# Patient Record
Sex: Female | Born: 1935 | Race: White | Hispanic: No | Marital: Married | Smoking: Former smoker
Health system: Southern US, Community
[De-identification: ages and names within clinical notes are randomized; demographics above are authoritative.]

## PROBLEM LIST (undated history)

## (undated) DIAGNOSIS — M810 Age-related osteoporosis without current pathological fracture: Secondary | ICD-10-CM

## (undated) DIAGNOSIS — H9190 Unspecified hearing loss, unspecified ear: Secondary | ICD-10-CM

## (undated) DIAGNOSIS — M549 Dorsalgia, unspecified: Secondary | ICD-10-CM

## (undated) DIAGNOSIS — G309 Alzheimer's disease, unspecified: Secondary | ICD-10-CM

## (undated) DIAGNOSIS — F329 Major depressive disorder, single episode, unspecified: Secondary | ICD-10-CM

## (undated) DIAGNOSIS — F0280 Dementia in other diseases classified elsewhere without behavioral disturbance: Secondary | ICD-10-CM

## (undated) DIAGNOSIS — R2681 Unsteadiness on feet: Secondary | ICD-10-CM

## (undated) DIAGNOSIS — F32A Depression, unspecified: Secondary | ICD-10-CM

## (undated) DIAGNOSIS — F0390 Unspecified dementia without behavioral disturbance: Secondary | ICD-10-CM

## (undated) DIAGNOSIS — F419 Anxiety disorder, unspecified: Secondary | ICD-10-CM

## (undated) DIAGNOSIS — I341 Nonrheumatic mitral (valve) prolapse: Secondary | ICD-10-CM

## (undated) DIAGNOSIS — I059 Rheumatic mitral valve disease, unspecified: Secondary | ICD-10-CM

## (undated) DIAGNOSIS — R32 Unspecified urinary incontinence: Secondary | ICD-10-CM

## (undated) DIAGNOSIS — G8929 Other chronic pain: Secondary | ICD-10-CM

## (undated) DIAGNOSIS — E0590 Thyrotoxicosis, unspecified without thyrotoxic crisis or storm: Secondary | ICD-10-CM

## (undated) DIAGNOSIS — E039 Hypothyroidism, unspecified: Secondary | ICD-10-CM

## (undated) HISTORY — PX: APPENDECTOMY: SHX54

## (undated) HISTORY — PX: OTHER SURGICAL HISTORY: SHX169

---

## 2008-10-17 ENCOUNTER — Inpatient Hospital Stay (HOSPITAL_COMMUNITY): Admission: EM | Admit: 2008-10-17 | Discharge: 2008-10-23 | Payer: Self-pay | Admitting: Emergency Medicine

## 2009-02-07 ENCOUNTER — Encounter: Payer: Self-pay | Admitting: Family Medicine

## 2009-06-24 ENCOUNTER — Emergency Department (HOSPITAL_COMMUNITY): Admission: EM | Admit: 2009-06-24 | Discharge: 2009-06-24 | Payer: Self-pay | Admitting: Emergency Medicine

## 2009-08-16 ENCOUNTER — Ambulatory Visit: Payer: Self-pay | Admitting: Diagnostic Radiology

## 2009-08-16 ENCOUNTER — Emergency Department (HOSPITAL_BASED_OUTPATIENT_CLINIC_OR_DEPARTMENT_OTHER): Admission: EM | Admit: 2009-08-16 | Discharge: 2009-08-16 | Payer: Self-pay | Admitting: Emergency Medicine

## 2010-06-07 LAB — GLUCOSE, CAPILLARY
Glucose-Capillary: 103 mg/dL — ABNORMAL HIGH (ref 70–99)
Glucose-Capillary: 105 mg/dL — ABNORMAL HIGH (ref 70–99)
Glucose-Capillary: 111 mg/dL — ABNORMAL HIGH (ref 70–99)
Glucose-Capillary: 113 mg/dL — ABNORMAL HIGH (ref 70–99)
Glucose-Capillary: 115 mg/dL — ABNORMAL HIGH (ref 70–99)
Glucose-Capillary: 123 mg/dL — ABNORMAL HIGH (ref 70–99)
Glucose-Capillary: 127 mg/dL — ABNORMAL HIGH (ref 70–99)
Glucose-Capillary: 129 mg/dL — ABNORMAL HIGH (ref 70–99)
Glucose-Capillary: 132 mg/dL — ABNORMAL HIGH (ref 70–99)
Glucose-Capillary: 142 mg/dL — ABNORMAL HIGH (ref 70–99)
Glucose-Capillary: 146 mg/dL — ABNORMAL HIGH (ref 70–99)
Glucose-Capillary: 152 mg/dL — ABNORMAL HIGH (ref 70–99)
Glucose-Capillary: 93 mg/dL (ref 70–99)
Glucose-Capillary: 95 mg/dL (ref 70–99)

## 2010-06-07 LAB — DIFFERENTIAL
Basophils Absolute: 0 10*3/uL (ref 0.0–0.1)
Basophils Relative: 1 % (ref 0–1)
Eosinophils Absolute: 0 10*3/uL (ref 0.0–0.7)
Eosinophils Relative: 0 % (ref 0–5)
Lymphocytes Relative: 8 % — ABNORMAL LOW (ref 12–46)
Lymphs Abs: 0.9 10*3/uL (ref 0.7–4.0)
Monocytes Absolute: 0.6 10*3/uL (ref 0.1–1.0)
Monocytes Relative: 5 % (ref 3–12)
Neutro Abs: 8.9 10*3/uL — ABNORMAL HIGH (ref 1.7–7.7)
Neutrophils Relative %: 85 % — ABNORMAL HIGH (ref 43–77)

## 2010-06-07 LAB — BASIC METABOLIC PANEL
BUN: 13 mg/dL (ref 6–23)
BUN: 13 mg/dL (ref 6–23)
BUN: 17 mg/dL (ref 6–23)
BUN: 8 mg/dL (ref 6–23)
BUN: 9 mg/dL (ref 6–23)
CO2: 26 mEq/L (ref 19–32)
CO2: 26 mEq/L (ref 19–32)
CO2: 27 mEq/L (ref 19–32)
Calcium: 8.1 mg/dL — ABNORMAL LOW (ref 8.4–10.5)
Calcium: 8.4 mg/dL (ref 8.4–10.5)
Calcium: 9.7 mg/dL (ref 8.4–10.5)
Chloride: 106 mEq/L (ref 96–112)
Creatinine, Ser: 0.46 mg/dL (ref 0.4–1.2)
Creatinine, Ser: 0.54 mg/dL (ref 0.4–1.2)
Creatinine, Ser: 0.55 mg/dL (ref 0.4–1.2)
GFR calc Af Amer: >60 mL/min (ref >60)
GFR calc Af Amer: >60 mL/min (ref >60)
GFR calc Af Amer: >60 mL/min (ref >60)
GFR calc Af Amer: >60 mL/min (ref >60)
GFR calc non Af Amer: >60 mL/min (ref >60)
GFR calc non Af Amer: >60 mL/min (ref >60)
GFR calc non Af Amer: >60 mL/min (ref >60)
GFR calc non Af Amer: >60 mL/min (ref >60)
GFR calc non Af Amer: >60 mL/min (ref >60)
Glucose, Bld: 100 mg/dL — ABNORMAL HIGH (ref 70–99)
Glucose, Bld: 100 mg/dL — ABNORMAL HIGH (ref 70–99)
Glucose, Bld: 129 mg/dL — ABNORMAL HIGH (ref 70–99)
Glucose, Bld: 89 mg/dL (ref 70–99)
Glucose, Bld: 93 mg/dL (ref 70–99)
Potassium: 3.4 mEq/L — ABNORMAL LOW (ref 3.5–5.1)
Potassium: 3.5 mEq/L (ref 3.5–5.1)
Potassium: 3.6 mEq/L (ref 3.5–5.1)
Potassium: 3.7 mEq/L (ref 3.5–5.1)
Potassium: 3.8 mEq/L (ref 3.5–5.1)
Sodium: 138 mEq/L (ref 135–145)
Sodium: 138 mEq/L (ref 135–145)
Sodium: 139 mEq/L (ref 135–145)
Sodium: 140 mEq/L (ref 135–145)

## 2010-06-07 LAB — POCT I-STAT EG7
Bicarbonate: 24.6 mEq/L — ABNORMAL HIGH (ref 20.0–24.0)
Calcium, Ion: 1.16 mmol/L (ref 1.12–1.32)
Sodium: 139 mEq/L (ref 135–145)
TCO2: 26 mmol/L (ref 0–100)
pCO2, Ven: 32.7 mmHg — ABNORMAL LOW (ref 45.0–50.0)
pO2, Ven: 51 mmHg — ABNORMAL HIGH (ref 30.0–45.0)

## 2010-06-07 LAB — CBC
HCT: 23.2 % — ABNORMAL LOW (ref 36.0–46.0)
HCT: 25.1 % — ABNORMAL LOW (ref 36.0–46.0)
HCT: 25.5 % — ABNORMAL LOW (ref 36.0–46.0)
HCT: 27.1 % — ABNORMAL LOW (ref 36.0–46.0)
HCT: 27.3 % — ABNORMAL LOW (ref 36.0–46.0)
Hemoglobin: 13.4 g/dL (ref 12.0–15.0)
Hemoglobin: 8.6 g/dL — ABNORMAL LOW (ref 12.0–15.0)
Hemoglobin: 9.3 g/dL — ABNORMAL LOW (ref 12.0–15.0)
MCHC: 34.4 g/dL (ref 30.0–36.0)
MCHC: 34.7 g/dL (ref 30.0–36.0)
Platelets: 116 10*3/uL — ABNORMAL LOW (ref 150–400)
Platelets: 132 10*3/uL — ABNORMAL LOW (ref 150–400)
Platelets: 151 10*3/uL (ref 150–400)
RBC: 2.7 MIL/uL — ABNORMAL LOW (ref 3.87–5.11)
RBC: 2.97 MIL/uL — ABNORMAL LOW (ref 3.87–5.11)
RBC: 4.69 MIL/uL (ref 3.87–5.11)
RDW: 13.7 % (ref 11.5–15.5)
RDW: 14.4 % (ref 11.5–15.5)
RDW: 14.6 % (ref 11.5–15.5)
RDW: 14.7 % (ref 11.5–15.5)
RDW: 15 % (ref 11.5–15.5)
WBC: 10 10*3/uL (ref 4.0–10.5)

## 2010-06-07 LAB — TYPE AND SCREEN
ABO/RH(D): A NEG
Antibody Screen: NEGATIVE

## 2010-06-07 LAB — URINALYSIS, MICROSCOPIC ONLY
Ketones, ur: NEGATIVE mg/dL
Protein, ur: NEGATIVE mg/dL
Urobilinogen, UA: 1 mg/dL (ref 0.0–1.0)

## 2010-06-07 LAB — PROTIME-INR: INR: 1.1 (ref 0.00–1.49)

## 2010-06-07 LAB — HEMOGLOBIN A1C
Hgb A1c MFr Bld: 5.4 % (ref 4.6–6.1)
Mean Plasma Glucose: 108 mg/dL

## 2010-06-07 LAB — APTT: aPTT: 26 seconds (ref 24–37)

## 2010-06-07 LAB — POCT CARDIAC MARKERS: Troponin i, poc: <0.05 ng/mL (ref 0.00–0.09)

## 2010-06-07 LAB — ABO/RH: ABO/RH(D): A NEG

## 2010-07-15 NOTE — Op Note (Signed)
NAME:  Jillian Wilkerson, Jillian Wilkerson              ACCOUNT NO.:  [id number]   MEDICAL RECORD NO.:  [id number]          PATIENT TYPE:  INP   LOCATION:  3086                         FACILITY:  [facility]   PHYSICIAN:  Dyke Brackett, M.D.    DATE OF BIRTH:  Dec 15, 1924   DATE OF PROCEDURE:  DATE OF DISCHARGE:                               OPERATIVE REPORT   PREOPERATIVE DIAGNOSIS:  Severe periprosthetic right femur fracture.   POSTOPERATIVE DIAGNOSIS:  Severe periprosthetic right femur fracture.   OPERATION:  Open reduction, internal fixation right femur periprosthetic  femur fracture with Synthes 16 hole periprosthetic plate.   SURGEON:  Dr. Madelon Lips.   ASSISTANT:  Smith, PA.   BLOOD LOSS:  Approximately 500.   DESCRIPTION OF PROCEDURE:  Sterile prep and drape, supine positioning  with an incision laterally.  An extended lateral incision had been made  due the fact that the prosthesis fracture which appeared to start more  at the tip of the prosthesis actually extended very proximal, probably  to include the proximal femur.  An extended lateral approach was made  with splitting of the iliotibial band and vastus lateralis.  Identification of a minimally comminuted, but very long spiral fracture.  The main component was in the sagittal plane.  It was relatively  shortened.  Provisional reduction was carried out with traction and  clamps in the plate.  Initially, it was thought that the plate would not  have to extend as far proximally.  It was extended as far proximally as  it could be relative to the flare of the greater trochanter proximally  and distally near the distal end of the femur.  Provisional clamps were  placed followed by one or two screws were placed distally.  This was  judged to be an acceptable position.  Then with the clamps in places,  the four cables were attached and Synthes curved plate with islets  __________good stable fixation relative to the four cables was obtained.  Two lag  screws were placed through an oblique portion of the fracture  distal to the cement mantle, followed by placement of cortical and  locking screws at the distal end of the plate.  Not all the screw holes  were placed used with screws, but good stable fixation distally and  really all cables were placed.  The most proximal islet could not be  utilized due to the exposure, but I felt like given the situation that  we had obtained excellent fixation.  Some question of the stem  stability, but it was not grossly loose and we did examined her under  fluoroscopy after placement of all screws and the stem and shaft moved  as a unit.  The hip seemed to be stable.  The wound was irrigated.  The  vastus lateralis and iliotibial band were closed separately using  #1 Vicryl.  A Hemovac drain placed deep to the vastus lateralis.  The  subcutaneous tissue with 0 and 2-0 Vicryl and the skin with stapling  device.  Marcaine with epinephrine.  Lightly pressure sterile dressing  applied.  Taken to the recovery  room in stable condition.      Dyke Brackett, M.D.  Electronically Signed     WDC/MEDQ  D:  10/17/2008  T:  10/18/2008  Job:  [id number]

## 2010-07-15 NOTE — Discharge Summary (Signed)
Jillian Wilkerson, Jillian Wilkerson              ACCOUNT NO.:  [id number]   MEDICAL RECORD NO.:  [id number]          PATIENT TYPE:  INP   LOCATION:  1537                         FACILITY:  [facility]   PHYSICIAN:  Dyke Brackett, M.D.    DATE OF BIRTH:  [birth year]   DATE OF ADMISSION:  10/17/2008  DATE OF DISCHARGE:  10/22/2008                               DISCHARGE SUMMARY   ADMISSION DIAGNOSIS:  Right humeral shaft fracture.   FINAL DIAGNOSIS:  Status post right femur open reduction and internal  fixation for right femur shaft fracture.   HISTORY:  The patient presented to emergency department following a  fall.  Right femur fracture below her hip prosthesis.  She was taken up  to the OR suite and ORIF was performed.  The patient was then taken to  the PACU in stable condition.  Blood loss about 500.   HOSPITAL COURSE:  Jillian Wilkerson is an 77 year old female who came from  the OR up to 1500 here at [facility] in stable condition after  being in the PACU.  Postoperative day #1 was on October 18, 2008.  The  patient was doing well, but her hemoglobin was low at 8 so we transfused  two units of packed red blood cells, was instructed to be 25%  weightbearing.  Consideration for skilled nursing facility.  On August  20,  postop day #2.  Had not  been up with physical therapy due to them  not coming to see her and get her up with her getting a blood  transfusion.  Otherwise the patient was doing well.  Pain well-  controlled with oral pain medicine, hemoglobin of 9.5, potassium at 3.4.  Gave her 20 mEq of potassium.  Get started with PT is highly recommended  at this point. Strong consideration for skilled nursing facility.  Postoperative day #3 would then October 20, 2008.  The patient  doing  well, vitals stable, hemoglobin 9.3 and exam is benign.  Postoperative  day #4 was a October 22, 2008.  The patient has been up with therapy,  doing well but only transfers and very slow with therapy.   Anticipates  skilled nursing facility.  Vitals stable, afebrile.  Hemoglobin 8.7.  Postoperative day #5, October 22, 2008.  The patient doing well.  Plan  for skilled nursing facility if bed available.  Not having any problems  with chest pain, abdominal pain, shortness of breath, vital signs  stable, afebrile.  Hemoglobin stable at 8.6.  Right lower extremity  neurovascularly intact, pulse 1+ and anticipation of skilled nursing  facility today if bed available.   ASSESSMENT/PLAN:  This 77 year old female, status post right femoral  shaft fracture open reduction and internal fixation.   DIET:  Regular.   ACTIVITY:  Would recommend 25% weightbearing with PT/OT on that right  lower extremity with rolling walker.   FOLLOW UP:  Plan to get her staples removed 14 days postoperatively.  She will need to make an appointment with Dr. Madelon Lips.  Call [phone]  for appointment approximately 8-9 days from discharge date from the  hospital.  WOUND CARE:  Recommended daily dressing changes.  Keep her wound covered  until we see her in the office for staple removal.   Call office for any temperatures over 101 degrees Fahrenheit.  The  patient is discharged in stable condition on the following medications.   DISCHARGE MEDICATIONS:  1. Darvocet N 100 1 tablet p.o. q.4 h p.r.n. pain.  2. Aricept 10 mg p.o. at bedtime.  3. Levothyroxine 75 mcg q.a.m.  4. Lexapro 10 mg q.a.m.  5. Simvastatin 20 mg at bedtime.  6. Calcium and vitamin D three times a day.  7. Boniva apparently only on Saturdays.  8. Robaxin 500 mg 1 tablet p.o. q.6-8h. p.r.n. pain.  9. Lovenox 40 mg subcu at 11 a.m. each morning.  Plan for a total of      15 days postoperatively which would be approximately September 1.      Sharol Given, [place]      Dyke Brackett, M.D.  Electronically Signed    JBS/MEDQ  D:  10/22/2008  T:  10/22/2008  Job:  [id number]

## 2011-03-05 ENCOUNTER — Inpatient Hospital Stay (HOSPITAL_COMMUNITY)
Admission: EM | Admit: 2011-03-05 | Discharge: 2011-03-09 | DRG: 193 | Payer: Medicare Other | Source: Ambulatory Visit | Attending: Internal Medicine | Admitting: Internal Medicine

## 2011-03-05 ENCOUNTER — Encounter: Payer: Self-pay | Admitting: Emergency Medicine

## 2011-03-05 ENCOUNTER — Emergency Department (HOSPITAL_COMMUNITY): Payer: Medicare Other

## 2011-03-05 ENCOUNTER — Other Ambulatory Visit: Payer: Self-pay

## 2011-03-05 DIAGNOSIS — J189 Pneumonia, unspecified organism: Secondary | ICD-10-CM

## 2011-03-05 DIAGNOSIS — G309 Alzheimer's disease, unspecified: Secondary | ICD-10-CM | POA: Diagnosis present

## 2011-03-05 DIAGNOSIS — T502X5A Adverse effect of carbonic-anhydrase inhibitors, benzothiadiazides and other diuretics, initial encounter: Secondary | ICD-10-CM | POA: Diagnosis present

## 2011-03-05 DIAGNOSIS — R32 Unspecified urinary incontinence: Secondary | ICD-10-CM | POA: Diagnosis present

## 2011-03-05 DIAGNOSIS — F3289 Other specified depressive episodes: Secondary | ICD-10-CM | POA: Diagnosis present

## 2011-03-05 DIAGNOSIS — F0390 Unspecified dementia without behavioral disturbance: Secondary | ICD-10-CM

## 2011-03-05 DIAGNOSIS — I5031 Acute diastolic (congestive) heart failure: Secondary | ICD-10-CM | POA: Diagnosis present

## 2011-03-05 DIAGNOSIS — I517 Cardiomegaly: Secondary | ICD-10-CM

## 2011-03-05 DIAGNOSIS — F329 Major depressive disorder, single episode, unspecified: Secondary | ICD-10-CM

## 2011-03-05 DIAGNOSIS — E0590 Thyrotoxicosis, unspecified without thyrotoxic crisis or storm: Secondary | ICD-10-CM | POA: Diagnosis present

## 2011-03-05 DIAGNOSIS — E876 Hypokalemia: Secondary | ICD-10-CM | POA: Diagnosis present

## 2011-03-05 DIAGNOSIS — F0280 Dementia in other diseases classified elsewhere without behavioral disturbance: Secondary | ICD-10-CM | POA: Diagnosis present

## 2011-03-05 HISTORY — DX: Anxiety disorder, unspecified: F41.9

## 2011-03-05 HISTORY — DX: Alzheimer's disease, unspecified: G30.9

## 2011-03-05 HISTORY — DX: Unspecified dementia, unspecified severity, without behavioral disturbance, psychotic disturbance, mood disturbance, and anxiety: F03.90

## 2011-03-05 HISTORY — DX: Thyrotoxicosis, unspecified without thyrotoxic crisis or storm: E05.90

## 2011-03-05 HISTORY — DX: Dementia in other diseases classified elsewhere, unspecified severity, without behavioral disturbance, psychotic disturbance, mood disturbance, and anxiety: F02.80

## 2011-03-05 HISTORY — DX: Major depressive disorder, single episode, unspecified: F32.9

## 2011-03-05 HISTORY — DX: Depression, unspecified: F32.A

## 2011-03-05 LAB — BASIC METABOLIC PANEL
BUN: 15 mg/dL (ref 6–23)
Chloride: 99 mEq/L (ref 96–112)
GFR calc Af Amer: >90 mL/min (ref >90)
GFR calc non Af Amer: 82 mL/min — ABNORMAL LOW (ref >90)
Potassium: 3.8 mEq/L (ref 3.5–5.1)
Sodium: 133 mEq/L — ABNORMAL LOW (ref 135–145)

## 2011-03-05 LAB — DIFFERENTIAL
Basophils Relative: 0 % (ref 0–1)
Eosinophils Absolute: 0 10*3/uL (ref 0.0–0.7)
Monocytes Relative: 5 % (ref 3–12)
Neutro Abs: 7.4 10*3/uL (ref 1.7–7.7)
Neutrophils Relative %: 86 % — ABNORMAL HIGH (ref 43–77)

## 2011-03-05 LAB — URINALYSIS, ROUTINE W REFLEX MICROSCOPIC
Bilirubin Urine: NEGATIVE
Nitrite: POSITIVE — AB
Specific Gravity, Urine: 1.023 (ref 1.005–1.030)
Urobilinogen, UA: 1 mg/dL (ref 0.0–1.0)
pH: 5.5 (ref 5.0–8.0)

## 2011-03-05 LAB — CBC
HCT: 39 % (ref 36.0–46.0)
Hemoglobin: 13.9 g/dL (ref 12.0–15.0)
Hemoglobin: 13 g/dL (ref 12.0–15.0)
MCHC: 33.3 g/dL (ref 30.0–36.0)
MCV: 90.1 fL (ref 78.0–100.0)
Platelets: 156 10*3/uL (ref 150–400)
RBC: 4.6 MIL/uL (ref 3.87–5.11)
RBC: 4.33 MIL/uL (ref 3.87–5.11)
RDW: 14.4 % (ref 11.5–15.5)
WBC: 5.9 10*3/uL (ref 4.0–10.5)

## 2011-03-05 LAB — GLUCOSE, CAPILLARY: Glucose-Capillary: 130 mg/dL — ABNORMAL HIGH (ref 70–99)

## 2011-03-05 LAB — CREATININE, SERUM
GFR calc Af Amer: >90 mL/min (ref >90)
GFR calc non Af Amer: 85 mL/min — ABNORMAL LOW (ref >90)

## 2011-03-05 LAB — URINE MICROSCOPIC-ADD ON

## 2011-03-05 MED ORDER — LEVOFLOXACIN IN D5W 750 MG/150ML IV SOLN: 750.0000 mg | INTRAVENOUS | Status: DC

## 2011-03-05 MED ORDER — ONDANSETRON HCL 4 MG PO TABS: 4.0000 mg | ORAL_TABLET | Freq: Four times a day (QID) | ORAL | Status: DC | PRN

## 2011-03-05 MED ORDER — ACETAMINOPHEN 325 MG PO TABS: 650.0000 mg | ORAL_TABLET | Freq: Four times a day (QID) | ORAL | Status: DC | PRN

## 2011-03-05 MED ORDER — LEVOFLOXACIN IN D5W 500 MG/100ML IV SOLN
500.0000 mg | INTRAVENOUS | Status: DC
  Administered 2011-03-06 – 2011-03-08 (×3): 500 mg via INTRAVENOUS
  Filled 2011-03-05 (×4): qty 100

## 2011-03-05 MED ORDER — SODIUM CHLORIDE 0.9 % IV SOLN: 250.0000 mL | INTRAVENOUS | Status: DC | PRN

## 2011-03-05 MED ORDER — VANCOMYCIN HCL 1000 MG IV SOLR: 1500.0000 mg | Freq: Once | INTRAVENOUS | Status: DC

## 2011-03-05 MED ORDER — SODIUM CHLORIDE 0.9 % IV BOLUS (SEPSIS)
1000.0000 mL | Freq: Once | INTRAVENOUS | Status: AC
  Administered 2011-03-05: 1000 mL via INTRAVENOUS

## 2011-03-05 MED ORDER — TRAZODONE 25 MG HALF TABLET
25.0000 mg | ORAL_TABLET | Freq: Every evening | ORAL | Status: DC | PRN
  Administered 2011-03-06: 25 mg via ORAL
  Filled 2011-03-05: qty 1

## 2011-03-05 MED ORDER — ENOXAPARIN SODIUM 30 MG/0.3ML ~~LOC~~ SOLN
30.0000 mg | Freq: Every day | SUBCUTANEOUS | Status: DC
  Administered 2011-03-05: 30 mg via SUBCUTANEOUS
  Filled 2011-03-05 (×2): qty 0.3

## 2011-03-05 MED ORDER — ACETAMINOPHEN 650 MG RE SUPP: 650.0000 mg | Freq: Four times a day (QID) | RECTAL | Status: DC | PRN

## 2011-03-05 MED ORDER — SODIUM CHLORIDE 0.9 % IJ SOLN: 3.0000 mL | INTRAMUSCULAR | Status: DC | PRN

## 2011-03-05 MED ORDER — DOCUSATE SODIUM 100 MG PO CAPS
100.0000 mg | ORAL_CAPSULE | Freq: Two times a day (BID) | ORAL | Status: DC
  Administered 2011-03-05 – 2011-03-09 (×8): 100 mg via ORAL
  Filled 2011-03-05 (×8): qty 1

## 2011-03-05 MED ORDER — NYSTATIN 100000 UNIT/GM EX CREA
TOPICAL_CREAM | Freq: Two times a day (BID) | CUTANEOUS | Status: DC
  Administered 2011-03-05 – 2011-03-09 (×8): via TOPICAL
  Filled 2011-03-05: qty 15

## 2011-03-05 MED ORDER — DEXTROSE 5 % IV SOLN: 2.0000 g | Freq: Three times a day (TID) | INTRAVENOUS | Status: DC

## 2011-03-05 MED ORDER — ONDANSETRON HCL 4 MG/2ML IJ SOLN: 4.0000 mg | Freq: Four times a day (QID) | INTRAMUSCULAR | Status: DC | PRN

## 2011-03-05 MED ORDER — VANCOMYCIN HCL 1000 MG IV SOLR
750.0000 mg | Freq: Two times a day (BID) | INTRAVENOUS | Status: DC
  Administered 2011-03-05 – 2011-03-08 (×6): 750 mg via INTRAVENOUS
  Filled 2011-03-05 (×7): qty 750

## 2011-03-05 MED ORDER — ALUM & MAG HYDROXIDE-SIMETH 200-200-20 MG/5ML PO SUSP: 30.0000 mL | Freq: Four times a day (QID) | ORAL | Status: DC | PRN

## 2011-03-05 MED ORDER — SODIUM CHLORIDE 0.9 % IJ SOLN: 3.0000 mL | Freq: Two times a day (BID) | INTRAMUSCULAR | Status: DC

## 2011-03-05 MED ORDER — LEVALBUTEROL HCL 0.63 MG/3ML IN NEBU
0.6300 mg | INHALATION_SOLUTION | Freq: Four times a day (QID) | RESPIRATORY_TRACT | Status: DC | PRN
  Administered 2011-03-08 (×2): 0.63 mg via RESPIRATORY_TRACT
  Filled 2011-03-05 (×2): qty 3

## 2011-03-05 NOTE — ED Notes (Signed)
Pt asking who put her here.  She believes she lives with her daughter and that her husband is in [place] hospital.

## 2011-03-05 NOTE — ED Provider Notes (Signed)
History     CSN: [id number]  Arrival date & time 03/05/11  1542   First MD Initiated Contact with Patient 03/05/11 1543      Chief Complaint  Patient presents with  . Seizures    (Consider location/radiation/quality/duration/timing/severity/associated sxs/prior treatment) HPI history from patient, nursing home nurse Rosalita Chessman) Level V caveat or dementia Patient is an 78 year old female with history of Alzheimer's who presents with convulsive episode. This started just prior to arrival. It was witnessed by nursing staff. Patient was sitting in her chair and had generalized extremity shaking for about 20-30 seconds. It resolved on its own. Patient was not talking during this episode but did not appear to be unconscious. Immediately following that episode, patient was acting her normal self. She has had a productive cough for the last several days. It has been green sputum. She has mild dyspnea with it. She has had no fever or chills. She has had no nausea, vomiting, diarrhea. She has had no urinary complaints. According to nursing home staff, patient recently diagnosed with pneumonia and started on azithromycin. Azithromycin started yesterday. Patient has never had seizure activities before. Overall severity noted be moderate and there been no modifying factors noted.  Past Medical History  Diagnosis Date  . Dementia   . Alzheimer disease   . Depression   . Osteoporosis   . Hearing loss   . Urinary incontinence   . Anxiety   . Hyperthyroidism   . Asthma     History reviewed. No pertinent past surgical history.  History reviewed. No pertinent family history.  History  Substance Use Topics  . Smoking status: Never Smoker   . Smokeless tobacco: Not on file  . Alcohol Use: No    OB History    Grav Para Term Preterm Abortions TAB SAB Ect Mult Living                  Review of Systems  Constitutional: Negative for fever and chills.  HENT: Negative for facial swelling.     Eyes: Negative for visual disturbance.  Respiratory: Positive for cough and shortness of breath. Negative for chest tightness and wheezing.   Cardiovascular: Negative for chest pain.  Gastrointestinal: Negative for nausea, vomiting, abdominal pain and diarrhea.  Genitourinary: Negative for difficulty urinating.  Skin: Negative for rash.  Neurological: Positive for tremors. Negative for weakness and numbness.  Psychiatric/Behavioral: Negative for behavioral problems and confusion.  All other systems reviewed and are negative.    Allergies  Codeine and Penicillins  Home Medications  No current outpatient prescriptions on file.  BP 133/67  Pulse 60  Temp(Src) 98.5 F (36.9 C) (Oral)  Resp 18  Ht 5\' 7"  (1.702 m)  Wt 195 lb 1.7 oz (88.5 kg)  BMI 30.56 kg/m2  SpO2 93%  Physical Exam  Nursing note and vitals reviewed. Constitutional: She appears well-developed and well-nourished. No distress.  HENT:  Head: Normocephalic.       Mildly dry mucous membranes  Eyes: EOM are normal. Pupils are equal, round, and reactive to light. No scleral icterus.  Neck: Normal range of motion. Neck supple.       No bruits  Cardiovascular: Normal rate, regular rhythm and intact distal pulses.   Pulmonary/Chest: Effort normal. No respiratory distress.       Coarse breath sounds with basilar crackles right worse than left No prolonged expiration or wheezes.  Abdominal: Soft. She exhibits no distension. There is no tenderness.  Musculoskeletal: Normal range of motion. She  exhibits no tenderness.       3+ LE edema on left without tenderness to palpation 2+ LE edema and right without tenderness to palpation Patient states this asymmetry is consistent with her baseline since she had bilateral knee replacements.  Neurological: She is alert. She has normal strength. No cranial nerve deficit or sensory deficit. She exhibits normal muscle tone. Coordination normal. GCS eye subscore is 4. GCS verbal  subscore is 5. GCS motor subscore is 6.       No pronator drift Patient alert and oriented to self. She knows that she is in a hospital but does not know where. She is also confused and sensitivity she lives with her daughter but actually lives in a nursing facility. Answers questions appropriately and follows commands.  Skin: Skin is warm and dry. No rash noted. She is not diaphoretic.  Psychiatric: She has a normal mood and affect. Her behavior is normal.    ED Course  Procedures (including critical care time)  ECG on 03/05/2011 1704: Heart rate 75. Normal sinus rhythm. Left axis deviation. No hypertrophy. First-degree heart block. Nonspecific T wave changes and 3 and aVF. Normal ST. Q waves in 3 and aVF. When compared to EKG from 10/17/2008, no significant changes. No acute ischemia.  Labs Reviewed  DIFFERENTIAL - Abnormal; Notable for the following:    Neutrophils Relative 86 (*)    Lymphocytes Relative 8 (*)    All other components within normal limits  BASIC METABOLIC PANEL - Abnormal; Notable for the following:    Sodium 133 (*)    Glucose, Bld 130 (*)    GFR calc non Af Amer 82 (*)    All other components within normal limits  URINALYSIS, ROUTINE W REFLEX MICROSCOPIC - Abnormal; Notable for the following:    APPearance CLOUDY (*)    Hgb urine dipstick LARGE (*)    Protein, ur 30 (*)    Nitrite POSITIVE (*)    Leukocytes, UA SMALL (*)    All other components within normal limits  GLUCOSE, CAPILLARY - Abnormal; Notable for the following:    Glucose-Capillary 130 (*)    All other components within normal limits  URINE MICROSCOPIC-ADD ON - Abnormal; Notable for the following:    Squamous Epithelial / LPF FEW (*)    Bacteria, UA MANY (*)    All other components within normal limits  CREATININE, SERUM - Abnormal; Notable for the following:    GFR calc non Af Amer 85 (*)    All other components within normal limits  CBC  CBC  POCT CBG MONITORING  URINE CULTURE   COMPREHENSIVE METABOLIC PANEL  CBC   Dg Chest 2 View  03/05/2011  *RADIOLOGY REPORT*  Clinical Data: Cough.  Diabetes.  Hypertension.  CHEST - 2 VIEW  Comparison: 10/17/2008  Findings: The heart is enlarged.  There is pulmonary mass congestion.  Mild interstitial edema.  No overt alveolar edema. There is minimal left lower lobe atelectasis or developing infiltrate.  Degenerative changes are seen in the spine.  IMPRESSION:  1.  Cardiomegaly and interstitial edema. 2.  Left lower lobe atelectasis or developing infiltrate.  Original Report Authenticated By: Patterson Hammersmith, M.D.   Ct Head Wo Contrast  03/05/2011  *RADIOLOGY REPORT*  Clinical Data: Possible seizure.  CT HEAD WITHOUT CONTRAST  Technique:  Contiguous axial images were obtained from the base of the skull through the vertex without contrast.  Comparison: 10/17/2008  Findings: The brain demonstrates no evidence of hemorrhage, acute  infarction, edema, mass effect, extra-axial fluid collection, hydrocephalus or mass lesion.  Atrophy and underlying small vessel ischemic disease present.  The skull is unremarkable.  Mild mucosal thickening of bilateral ethmoid air cells.  IMPRESSION: No acute findings.  Atrophy and small vessel disease.  Original Report Authenticated By: Reola Calkins, M.D.     No diagnosis found. Diagnosis: Healthcare acquired pneumonia  MDM   Clinical picture consistent with healthcare acquired pneumonia. Patient lives in nursing facility has had productive cough and dyspnea and has opacity on chest x-ray. She is hemodynamically stable and stable from a respiratory standpoint on a couple liters of nasal cannula oxygen. She had reported convulsive episode, however does not appear she loss consciousness. This may have been Reiger's or cough type syncope episode. CT scan is negative and I strongly doubt this was new-onset seizure activity. Antibiotics chosen based on patient's allergy history. Internal medicine consult and  admitted patient.        Milus Glazier 03/06/11 [phone]

## 2011-03-05 NOTE — H&P (Signed)
Jillian Wilkerson is an 78 y.o. female.   Chief Complaint: shortness of breath  HPI: The pt resides at [facility] due to dementia and a few days ago started c/o shortness of breath and was diagnosed with pneumonia.  She was started on Z-pak and initially was feeling better but continued to have some cough, therefore, the NH staff checked on her often.  She has dementia and cannot provide [place] with much history but supposedly her cough is worse and she also c/o chills to the NH staff, her temp was found to be normal but her sats were found to be in the 80's and she was therefore brought in for further evaluation  Past Medical History  Diagnosis Date  . Dementia   . Alzheimer disease   . Depression   . Osteoporosis   . Hearing loss   . Urinary incontinence   . Anxiety   . Hyperthyroidism     History reviewed. No pertinent past surgical history.  No family history on file. Social History:  reports that she has never smoked. She does not have any smokeless tobacco history on file. She reports that she does not drink alcohol. Her drug history not on file.  Allergies:  Allergies  Allergen Reactions  . Codeine Other (See Comments)    Labored breathing  . Penicillins Rash    Medications Prior to Admission  Medication Dose Route Frequency Provider Last Rate Last Dose  . levofloxacin (LEVAQUIN) IVPB 500 mg  500 mg Intravenous Q24H Meghen Akopyan      . nystatin cream (MYCOSTATIN)   Topical BID Milus Glazier      . sodium chloride 0.9 % bolus 1,000 mL  1,000 mL Intravenous Once [employer]   1,000 mL at 03/05/11 1628  . vancomycin (VANCOCIN) 750 mg in sodium chloride 0.9 % 150 mL IVPB  750 mg Intravenous Q12H Madolyn Frieze, PHARMD      . DISCONTD: aztreonam (AZACTAM) 2 g in dextrose 5 % 50 mL IVPB  2 g Intravenous Q8H Mike Schinlever      . DISCONTD: Levofloxacin (LEVAQUIN) IVPB 750 mg  750 mg Intravenous Q24H Milus Glazier      . DISCONTD: vancomycin (VANCOCIN)  1,500 mg in sodium chloride 0.9 % 500 mL IVPB  1,500 mg Intravenous Once Milus Glazier       No current outpatient prescriptions on file as of 03/05/2011.    Results for orders placed during the hospital encounter of 03/05/11 (from the past 48 hour(s))  GLUCOSE, CAPILLARY     Status: Abnormal   Collection Time   03/05/11  4:23 PM      Component Value Range Comment   Glucose-Capillary 130 (*) 70 - 99 (mg/dL)   CBC     Status: Normal   Collection Time   03/05/11  4:39 PM      Component Value Range Comment   WBC 8.7  4.0 - 10.5 (K/uL)    RBC 4.60  3.87 - 5.11 (MIL/uL)    Hemoglobin 13.9  12.0 - 15.0 (g/dL)    HCT 21.3  08.6 - 57.8 (%)    MCV 90.7  78.0 - 100.0 (fL)    MCH 30.2  26.0 - 34.0 (pg)    MCHC 33.3  30.0 - 36.0 (g/dL)    RDW 46.9  62.9 - 52.8 (%)    Platelets 156  150 - 400 (K/uL)   DIFFERENTIAL     Status: Abnormal   Collection Time  03/05/11  4:39 PM      Component Value Range Comment   Neutrophils Relative 86 (*) 43 - 77 (%)    Neutro Abs 7.4  1.7 - 7.7 (K/uL)    Lymphocytes Relative 8 (*) 12 - 46 (%)    Lymphs Abs 0.7  0.7 - 4.0 (K/uL)    Monocytes Relative 5  3 - 12 (%)    Monocytes Absolute 0.5  0.1 - 1.0 (K/uL)    Eosinophils Relative 0  0 - 5 (%)    Eosinophils Absolute 0.0  0.0 - 0.7 (K/uL)    Basophils Relative 0  0 - 1 (%)    Basophils Absolute 0.0  0.0 - 0.1 (K/uL)   BASIC METABOLIC PANEL     Status: Abnormal   Collection Time   03/05/11  4:39 PM      Component Value Range Comment   Sodium 133 (*) 135 - 145 (mEq/L)    Potassium 3.8  3.5 - 5.1 (mEq/L)    Chloride 99  96 - 112 (mEq/L)    CO2 24  19 - 32 (mEq/L)    Glucose, Bld 130 (*) 70 - 99 (mg/dL)    BUN 15  6 - 23 (mg/dL)    Creatinine, Ser 4.09  0.50 - 1.10 (mg/dL)    Calcium 9.3  8.4 - 10.5 (mg/dL)    GFR calc non Af Amer 82 (*) >90 (mL/min)    GFR calc Af Amer >90  >90 (mL/min)   URINALYSIS, ROUTINE W REFLEX MICROSCOPIC     Status: Abnormal   Collection Time   03/05/11  5:01 PM      Component Value  Range Comment   Color, Urine YELLOW  YELLOW     APPearance CLOUDY (*) CLEAR     Specific Gravity, Urine 1.023  1.005 - 1.030     pH 5.5  5.0 - 8.0     Glucose, UA NEGATIVE  NEGATIVE (mg/dL)    Hgb urine dipstick LARGE (*) NEGATIVE     Bilirubin Urine NEGATIVE  NEGATIVE     Ketones, ur NEGATIVE  NEGATIVE (mg/dL)    Protein, ur 30 (*) NEGATIVE (mg/dL)    Urobilinogen, UA 1.0  0.0 - 1.0 (mg/dL)    Nitrite POSITIVE (*) NEGATIVE     Leukocytes, UA SMALL (*) NEGATIVE    URINE MICROSCOPIC-ADD ON     Status: Abnormal   Collection Time   03/05/11  5:01 PM      Component Value Range Comment   Squamous Epithelial / LPF FEW (*) RARE     WBC, UA 11-20  <3 (WBC/hpf)    RBC / HPF 3-6  <3 (RBC/hpf)    Bacteria, UA MANY (*) RARE     Dg Chest 2 View  03/05/2011  *RADIOLOGY REPORT*  Clinical Data: Cough.  Diabetes.  Hypertension.  CHEST - 2 VIEW  Comparison: 10/17/2008  Findings: The heart is enlarged.  There is pulmonary mass congestion.  Mild interstitial edema.  No overt alveolar edema. There is minimal left lower lobe atelectasis or developing infiltrate.  Degenerative changes are seen in the spine.  IMPRESSION:  1.  Cardiomegaly and interstitial edema. 2.  Left lower lobe atelectasis or developing infiltrate.  Original Report Authenticated By: Patterson Hammersmith, M.D.   Ct Head Wo Contrast  03/05/2011  *RADIOLOGY REPORT*  Clinical Data: Possible seizure.  CT HEAD WITHOUT CONTRAST  Technique:  Contiguous axial images were obtained from the base of the skull through the  vertex without contrast.  Comparison: 10/17/2008  Findings: The brain demonstrates no evidence of hemorrhage, acute infarction, edema, mass effect, extra-axial fluid collection, hydrocephalus or mass lesion.  Atrophy and underlying small vessel ischemic disease present.  The skull is unremarkable.  Mild mucosal thickening of bilateral ethmoid air cells.  IMPRESSION: No acute findings.  Atrophy and small vessel disease.  Original Report  Authenticated By: Reola Calkins, M.D.    Review of Systems  Constitutional: Positive for chills, malaise/fatigue and diaphoresis. Negative for fever and weight loss.  HENT: Positive for congestion. Negative for hearing loss, ear pain, nosebleeds, sore throat, neck pain, tinnitus and ear discharge.   Eyes: Negative for blurred vision, double vision, photophobia, pain, discharge and redness.  Respiratory: Positive for cough, sputum production, shortness of breath and wheezing. Negative for hemoptysis and stridor.   Cardiovascular: Negative for chest pain, palpitations, orthopnea, claudication, leg swelling and PND.  Gastrointestinal: Negative for heartburn, nausea, vomiting, abdominal pain, diarrhea, constipation, blood in stool and melena.  Genitourinary: Negative for dysuria, urgency, frequency, hematuria and flank pain.  Musculoskeletal: Positive for myalgias. Negative for back pain, joint pain and falls.  Skin: Negative for itching and rash.  Neurological: Positive for weakness. Negative for dizziness, tingling, tremors, sensory change, speech change, focal weakness, seizures, loss of consciousness and headaches.  Endo/Heme/Allergies: Negative for environmental allergies and polydipsia. Does not bruise/bleed easily.  Psychiatric/Behavioral: Positive for memory loss. Negative for depression, suicidal ideas, hallucinations and substance abuse. The patient is not nervous/anxious and does not have insomnia.     Blood pressure 129/80, pulse 78, temperature 97.6 F (36.4 C), temperature source Oral, resp. rate 22, SpO2 96.00%. Physical Exam  Constitutional: She appears well-developed and well-nourished. No distress.  HENT:  Head: Normocephalic and atraumatic.  Mouth/Throat: No oropharyngeal exudate.  Eyes: Conjunctivae and EOM are normal. Pupils are equal, round, and reactive to light. Right eye exhibits no discharge. No scleral icterus.  Neck: Normal range of motion. Neck supple. No JVD  present. No tracheal deviation present. No thyromegaly present.  Cardiovascular: Normal rate, regular rhythm, normal heart sounds and intact distal pulses.  Exam reveals no gallop and no friction rub.   No murmur heard. Respiratory: No stridor. She is in respiratory distress. She has wheezes. She has rales. She exhibits no tenderness.  GI: Soft. Bowel sounds are normal. She exhibits no distension and no mass. There is no rebound and no guarding.  Musculoskeletal: Normal range of motion. She exhibits no edema and no tenderness.  Lymphadenopathy:    She has no cervical adenopathy.  Neurological: She is alert. No cranial nerve deficit. Coordination normal.  Skin: Skin is warm and dry. No rash noted. She is not diaphoretic. No erythema. No pallor.  Psychiatric: She has a normal mood and affect. Her behavior is normal.     Assessment/Plan 1. Pneumonia vs bronchitis- the pt will treated as pneumonia and will treat as HCAP.  Unfortunately she is allergic to PCN and therefore we cannot do Zosyn or Imipenem because of anaphylactic shock with PCN but will start her on Levaquin and Vanco.  She will also get some breathing treatments prn and will cont the rest of her home meds as Rx 2.  Dementia- chronic/stable, will cont home meds  Melene Plan 03/05/2011, 6:57 PM

## 2011-03-05 NOTE — ED Notes (Signed)
3002-01 Ready 

## 2011-03-05 NOTE — ED Provider Notes (Signed)
I saw and evaluated the patient, reviewed the resident's note and I agree with the findings and plan.  EKG was personally reviewed by myself.  EKG: 5:12 PM Rhythm: normal sinus Rate: 75 Axis: left Intervals/Conduction: 1st degree av block, poor r wave progression, q waves inferiorly ST segments: NS ST changes. t wave flattening in III and aVF  86yf with sent from NH for possible seizure. Generalized shaking movement witnessed by staff although seems like conscious during this and did not appear to be postictal. No hx of seizure. Pt currently c/p feeling tired and cough. Just started on azithromycin for pneumonia. On exam is sitting up in bed and in NAD. Wet sounding cough. L sided rhonchi. No increased WOB. Heart regular. Abdomen benign. Pitting symmetric b/l LE edema. CXR with L sided infiltrate. Clinically pneumonia and will tx as such. Given pt's age she is at increased for morbidity/mortality and feel most appropriate setting is hospital admission to monitor response to treatment.   Raeford Razor, MD 03/05/11 1730

## 2011-03-05 NOTE — ED Notes (Signed)
MD assessed pt perineal area and will rx something for yeast infection

## 2011-03-05 NOTE — ED Notes (Signed)
In and out cath completed.  Perineal area is greatly swollen, red appears to have yeast infection.  Pt says it is painful to touch.  Skin breakdown to the poin t of sloughing off and being raw

## 2011-03-05 NOTE — ED Notes (Signed)
CBG 130 

## 2011-03-05 NOTE — Progress Notes (Signed)
ANTIBIOTIC CONSULT NOTE - INITIAL  Pharmacy Consult for Vancomycin Indication: pneumonia  Allergies  Allergen Reactions  . Codeine Other (See Comments)    Labored breathing  . Penicillins Rash    Patient Measurements:   Weight from records: 75.4kg  Vital Signs: Temp: 97.6 F (36.4 C) (01/03 1808) Temp src: Oral (01/03 1808) BP: 129/80 mmHg (01/03 1749) Pulse Rate: 78  (01/03 1749) Intake/Output from previous day:   Intake/Output from this shift:    Labs:  [facility] 03/05/11 1639  WBC 8.7  HGB 13.9  PLT 156  LABCREA --  CREATININE 0.59   CrCl is unknown because there is no height on file for the current visit. No results found for this basename: VANCOTROUGH:2,VANCOPEAK:2,VANCORANDOM:2,GENTTROUGH:2,GENTPEAK:2,GENTRANDOM:2,TOBRATROUGH:2,TOBRAPEAK:2,TOBRARND:2,AMIKACINPEAK:2,AMIKACINTROU:2,AMIKACIN:2, in the last 72 hours   Microbiology: No results found for this or any previous visit (from the past 720 hour(s)).  Medical History: Past Medical History  Diagnosis Date  . Dementia   . Alzheimer disease   . Depression   . Osteoporosis   . Hearing loss   . Urinary incontinence   . Anxiety   . Hyperthyroidism     Medications:  Prescriptions prior to admission  Medication Sig Dispense Refill  . levothyroxine (SYNTHROID, LEVOTHROID) 75 MCG tablet Take 75 mcg by mouth daily.        . simvastatin (ZOCOR) 40 MG tablet Take 40 mg by mouth every evening.         Assessment: Pneumonia: to begin empiric antibiotic therapy with Vancomycin and Levaquin.  Renal function appears normal.  Goal of Therapy:  Vancomycin trough level 15-20 mcg/ml  Plan:  Vancomycin 750mg  IV q12h Monitor renal function  Check Vancomycin trough at steady-state  Estella Husk, Pharm.D., BCPS Clinical Pharmacist  Pager [phone] 03/05/2011, 6:49 PM

## 2011-03-05 NOTE — ED Notes (Signed)
Called report to [phone] and spoke to [place]

## 2011-03-05 NOTE — ED Notes (Signed)
Patient in a chair staff at nursing home checked on patient and had a seizure witnessed by staff lasting 30 seconds.  Called EMS when they arrived patient ax4.  Staff told EMS Currently on antibiotics for pneumonia. Patient is coughing upon arrival ax4.  No distress noted

## 2011-03-06 DIAGNOSIS — I359 Nonrheumatic aortic valve disorder, unspecified: Secondary | ICD-10-CM

## 2011-03-06 LAB — COMPREHENSIVE METABOLIC PANEL
ALT: 11 U/L (ref 0–35)
AST: 34 U/L (ref 0–37)
Albumin: 2.8 g/dL — ABNORMAL LOW (ref 3.5–5.2)
Chloride: 102 mEq/L (ref 96–112)
Creatinine, Ser: 0.48 mg/dL — ABNORMAL LOW (ref 0.50–1.10)
Potassium: 4 mEq/L (ref 3.5–5.1)
Sodium: 137 mEq/L (ref 135–145)
Total Bilirubin: 0.3 mg/dL (ref 0.3–1.2)

## 2011-03-06 LAB — CBC
MCH: 30.4 pg (ref 26.0–34.0)
MCHC: 33.5 g/dL (ref 30.0–36.0)
MCV: 90.8 fL (ref 78.0–100.0)
Platelets: 183 10*3/uL (ref 150–400)
RBC: 4.01 MIL/uL (ref 3.87–5.11)

## 2011-03-06 MED ORDER — ENOXAPARIN SODIUM 40 MG/0.4ML ~~LOC~~ SOLN
40.0000 mg | Freq: Every day | SUBCUTANEOUS | Status: DC
  Administered 2011-03-06 – 2011-03-09 (×4): 40 mg via SUBCUTANEOUS
  Filled 2011-03-06 (×4): qty 0.4

## 2011-03-06 MED ORDER — FUROSEMIDE 10 MG/ML IJ SOLN
40.0000 mg | Freq: Two times a day (BID) | INTRAMUSCULAR | Status: DC
  Administered 2011-03-06 – 2011-03-07 (×3): 40 mg via INTRAVENOUS
  Filled 2011-03-06 (×6): qty 4

## 2011-03-06 NOTE — Progress Notes (Signed)
Utilization review completed. Toren Tucholski Watson 03/06/2011 

## 2011-03-06 NOTE — Progress Notes (Signed)
Subjective: This lady says she feels somewhat better. She still has a cough which is sounding bronchitic in nature. She did describe cough productive of yellow sputum. She denies fever . She is not dyspneic at rest. She is saturating above 92% on room air.           Physical Exam: Blood pressure 117/69, pulse 50, temperature 98 F (36.7 C), temperature source Oral, resp. rate 18, height 5\' 7"  (1.702 m), weight 88.5 kg (195 lb 1.7 oz), SpO2 94.00%. She looks systemically well. There is no increased work of breathing. There is no peripheral central cyanosis. Lung fields show bilateral crackles which are inspiratory and expiratory and scattered. There is no wheezing or bronchial breathing. Jugular venous pressure not raised. Heart sounds are present and normal without murmurs. There is no peripheral pitting edema.   Investigations:     Basic Metabolic Panel:  Basename 03/06/11 0440 03/05/11 2140 03/05/11 1639  NA 137 -- 133*  K 4.0 -- 3.8  CL 102 -- 99  CO2 26 -- 24  GLUCOSE 91 -- 130*  BUN 12 -- 15  CREATININE 0.48* 0.53 --  CALCIUM 8.7 -- 9.3  MG -- -- --  PHOS -- -- --   Liver Function Tests:  [facility] 03/06/11 0440  AST 34  ALT 11  ALKPHOS 40  BILITOT 0.3  PROT 6.7  ALBUMIN 2.8*     CBC:  Basename 03/06/11 0440 03/05/11 2140 03/05/11 1639  WBC 5.4 5.9 --  NEUTROABS -- -- 7.4  HGB 12.2 13.0 --  HCT 36.4 39.0 --  MCV 90.8 90.1 --  PLT 183 151 --    Dg Chest 2 View  03/05/2011  *RADIOLOGY REPORT*  Clinical Data: Cough.  Diabetes.  Hypertension.  CHEST - 2 VIEW  Comparison: 10/17/2008  Findings: The heart is enlarged.  There is pulmonary mass congestion.  Mild interstitial edema.  No overt alveolar edema. There is minimal left lower lobe atelectasis or developing infiltrate.  Degenerative changes are seen in the spine.  IMPRESSION:  1.  Cardiomegaly and interstitial edema. 2.  Left lower lobe atelectasis or developing infiltrate.  Original Report Authenticated  By: Patterson Hammersmith, M.D.   Ct Head Wo Contrast  03/05/2011  *RADIOLOGY REPORT*  Clinical Data: Possible seizure.  CT HEAD WITHOUT CONTRAST  Technique:  Contiguous axial images were obtained from the base of the skull through the vertex without contrast.  Comparison: 10/17/2008  Findings: The brain demonstrates no evidence of hemorrhage, acute infarction, edema, mass effect, extra-axial fluid collection, hydrocephalus or mass lesion.  Atrophy and underlying small vessel ischemic disease present.  The skull is unremarkable.  Mild mucosal thickening of bilateral ethmoid air cells.  IMPRESSION: No acute findings.  Atrophy and small vessel disease.  Original Report Authenticated By: Reola Calkins, M.D.      Medications: I have reviewed the patient's current medications.  Impression: 1. Possible left lower lobe pneumonia. 2. Cardiomegaly and possibly pulmonary edema on chest x-ray. Clinically I think she has bronchitis and/or pneumonia. However the chest x-ray is suspicious for pulmonary edema.     Plan: 1. Intravenous Lasix. 2. Continue intravenous antibiotics. 3. 2-D echocardiogram. 4. Repeat chest x-ray tomorrow.     LOS: 1 day   Wilson Singer Pager [phone]  03/06/2011, 8:33 AM

## 2011-03-06 NOTE — Progress Notes (Signed)
  Echocardiogram 2D Echocardiogram has been performed.  Zander Ingham, Real Cons 03/06/2011, 2:36 PM

## 2011-03-07 ENCOUNTER — Inpatient Hospital Stay (HOSPITAL_COMMUNITY): Payer: Medicare Other

## 2011-03-07 DIAGNOSIS — E876 Hypokalemia: Secondary | ICD-10-CM | POA: Clinically undetermined

## 2011-03-07 DIAGNOSIS — E0590 Thyrotoxicosis, unspecified without thyrotoxic crisis or storm: Secondary | ICD-10-CM | POA: Diagnosis present

## 2011-03-07 DIAGNOSIS — R32 Unspecified urinary incontinence: Secondary | ICD-10-CM | POA: Diagnosis present

## 2011-03-07 DIAGNOSIS — J189 Pneumonia, unspecified organism: Secondary | ICD-10-CM | POA: Diagnosis present

## 2011-03-07 DIAGNOSIS — I517 Cardiomegaly: Secondary | ICD-10-CM | POA: Diagnosis present

## 2011-03-07 DIAGNOSIS — F329 Major depressive disorder, single episode, unspecified: Secondary | ICD-10-CM | POA: Diagnosis present

## 2011-03-07 DIAGNOSIS — F0390 Unspecified dementia without behavioral disturbance: Secondary | ICD-10-CM | POA: Diagnosis present

## 2011-03-07 DIAGNOSIS — F32A Depression, unspecified: Secondary | ICD-10-CM | POA: Diagnosis present

## 2011-03-07 LAB — CBC
HCT: 40.8 % (ref 36.0–46.0)
Hemoglobin: 13.3 g/dL (ref 12.0–15.0)
MCH: 29.8 pg (ref 26.0–34.0)
MCV: 91.3 fL (ref 78.0–100.0)
RBC: 4.47 MIL/uL (ref 3.87–5.11)
WBC: 4.9 10*3/uL (ref 4.0–10.5)

## 2011-03-07 LAB — PRO B NATRIURETIC PEPTIDE: Pro B Natriuretic peptide (BNP): 117.2 pg/mL (ref 0–450)

## 2011-03-07 LAB — COMPREHENSIVE METABOLIC PANEL
AST: 29 U/L (ref 0–37)
BUN: 15 mg/dL (ref 6–23)
CO2: 26 mEq/L (ref 19–32)
Calcium: 8.7 mg/dL (ref 8.4–10.5)
Chloride: 104 mEq/L (ref 96–112)
Creatinine, Ser: 0.64 mg/dL (ref 0.50–1.10)
GFR calc Af Amer: >90 mL/min (ref >90)
GFR calc non Af Amer: 80 mL/min — ABNORMAL LOW (ref >90)
Glucose, Bld: 83 mg/dL (ref 70–99)
Total Bilirubin: 0.3 mg/dL (ref 0.3–1.2)

## 2011-03-07 LAB — URINE CULTURE: Culture  Setup Time: 201301031803

## 2011-03-07 MED ORDER — POTASSIUM CHLORIDE CRYS ER 20 MEQ PO TBCR
20.0000 meq | EXTENDED_RELEASE_TABLET | Freq: Once | ORAL | Status: AC
  Administered 2011-03-07: 20 meq via ORAL
  Filled 2011-03-07: qty 1

## 2011-03-07 MED ORDER — FUROSEMIDE 20 MG PO TABS
20.0000 mg | ORAL_TABLET | Freq: Two times a day (BID) | ORAL | Status: DC
  Administered 2011-03-07 – 2011-03-09 (×4): 20 mg via ORAL
  Filled 2011-03-07 (×6): qty 1

## 2011-03-07 NOTE — Progress Notes (Signed)
Subjective: Patient was inquiring where her black walker was.  Still short of breath.  Objective: Vital signs in last 24 hours: Filed Vitals:   03/06/11 2145 03/07/11 0455 03/07/11 0645 03/07/11 1348  BP: 115/73  125/80 126/76  Pulse: 78  68 81  Temp: 99.7 F (37.6 C)  98.4 F (36.9 C) 98.2 F (36.8 C)  TempSrc: Oral  Oral Oral  Resp: 20  20 20   Height:      Weight:  86.6 kg (190 lb 14.7 oz) 85.5 kg (188 lb 7.9 oz)   SpO2: 94%  93% 94%   Weight change: -1.9 kg (-4 lb 3 oz)  Intake/Output Summary (Last 24 hours) at 03/07/11 1501 Last data filed at 03/07/11 1224  Gross per 24 hour  Intake   1430 ml  Output      8 ml  Net   1422 ml    Physical Exam: General: Awake, Oriented, No acute distress. HEENT: EOMI. Neck: Supple CV: S1 and S2 Lungs: Coarse breath sounds bilaterally, crackles at bases. Abdomen: Soft, Nontender, Nondistended, +bowel sounds. Ext: Good pulses. Trace edema.  Lab Results:  [facility] 03/07/11 0655 03/06/11 0440  NA 142 137  K 3.1* 4.0  CL 104 102  CO2 26 26  GLUCOSE 83 91  BUN 15 12  CREATININE 0.64 0.48*  CALCIUM 8.7 8.7  MG -- --  PHOS -- --    Basename 03/07/11 0655 03/06/11 0440  AST 29 34  ALT 15 11  ALKPHOS 43 40  BILITOT 0.3 0.3  PROT 7.0 6.7  ALBUMIN 2.9* 2.8*   No results found for this basename: LIPASE:2,AMYLASE:2 in the last 72 hours  Basename 03/07/11 0655 03/06/11 0440 03/05/11 1639  WBC 4.9 5.4 --  NEUTROABS -- -- 7.4  HGB 13.3 12.2 --  HCT 40.8 36.4 --  MCV 91.3 90.8 --  PLT 168 183 --   No results found for this basename: CKTOTAL:3,CKMB:3,CKMBINDEX:3,TROPONINI:3 in the last 72 hours No components found with this basename: POCBNP:3 No results found for this basename: DDIMER:2 in the last 72 hours No results found for this basename: HGBA1C:2 in the last 72 hours No results found for this basename: CHOL:2,HDL:2,LDLCALC:2,TRIG:2,CHOLHDL:2,LDLDIRECT:2 in the last 72 hours No results found for this basename:  TSH,T4TOTAL,FREET3,T3FREE,THYROIDAB in the last 72 hours No results found for this basename: VITAMINB12:2,FOLATE:2,FERRITIN:2,TIBC:2,IRON:2,RETICCTPCT:2 in the last 72 hours  Micro Results: Recent Results (from the past 240 hour(s))  URINE CULTURE     Status: Normal   Collection Time   03/05/11  5:01 PM      Component Value Range Status Comment   Specimen Description URINE, CATHETERIZED   Final    Special Requests NONE   Final    Setup Time [id number]   Final    Colony Count 75,000 COLONIES/ML   Final    Culture ESCHERICHIA COLI   Final    Report Status 03/07/2011 FINAL   Final    Organism ID, Bacteria ESCHERICHIA COLI   Final     Studies/Results: Dg Chest 2 View  03/07/2011  *RADIOLOGY REPORT*  Clinical Data: Shortness of breath, weakness  CHEST - 2 VIEW  Comparison: 03/05/2011  Findings: Possible mild interstitial edema, improved.  Mild left basilar atelectasis. No pleural effusion or pneumothorax.  Mild cardiomegaly.  Degenerative changes of the visualized thoracolumbar spine.  IMPRESSION: Mild cardiomegaly with possible mild interstitial edema, improved.  Left basilar atelectasis.  Original Report Authenticated By: Charline Bills, M.D.   Dg Chest 2 View  03/05/2011  *RADIOLOGY  REPORT*  Clinical Data: Cough.  Diabetes.  Hypertension.  CHEST - 2 VIEW  Comparison: 10/17/2008  Findings: The heart is enlarged.  There is pulmonary mass congestion.  Mild interstitial edema.  No overt alveolar edema. There is minimal left lower lobe atelectasis or developing infiltrate.  Degenerative changes are seen in the spine.  IMPRESSION:  1.  Cardiomegaly and interstitial edema. 2.  Left lower lobe atelectasis or developing infiltrate.  Original Report Authenticated By: Patterson Hammersmith, M.D.   Ct Head Wo Contrast  03/05/2011  *RADIOLOGY REPORT*  Clinical Data: Possible seizure.  CT HEAD WITHOUT CONTRAST  Technique:  Contiguous axial images were obtained from the base of the skull through the vertex  without contrast.  Comparison: 10/17/2008  Findings: The brain demonstrates no evidence of hemorrhage, acute infarction, edema, mass effect, extra-axial fluid collection, hydrocephalus or mass lesion.  Atrophy and underlying small vessel ischemic disease present.  The skull is unremarkable.  Mild mucosal thickening of bilateral ethmoid air cells.  IMPRESSION: No acute findings.  Atrophy and small vessel disease.  Original Report Authenticated By: Reola Calkins, M.D.   Patient had a 2-D echocardiogram on 03/06/2011 Study Conclusions - Left ventricle: The cavity size was normal. Wall thickness was increased in a pattern of mild LVH. The estimated ejection fraction was 55%. Regional wall motion abnormalities cannot be excluded. - Aortic valve: Mild regurgitation. - Aorta: There is dilitation of the ascending aorta at 46mm. - Right ventricle: The cavity size was mildly dilated. Systolic function was mildly reduced. - Right atrium: The atrium was mildly dilated.    Medications: I have reviewed the patient's current medications. Scheduled Meds:   . docusate sodium  100 mg Oral BID  . enoxaparin  40 mg Subcutaneous QHS  . furosemide  40 mg Intravenous BID  . levofloxacin (LEVAQUIN) IV  500 mg Intravenous Q24H  . nystatin cream   Topical BID  . potassium chloride  20 mEq Oral Once  . vancomycin  750 mg Intravenous Q12H   Continuous Infusions:  PRN Meds:.acetaminophen, acetaminophen, alum & mag hydroxide-simeth, levalbuterol, ondansetron (ZOFRAN) IV, ondansetron, traZODone  Assessment/Plan: 1. Pneumonia, left lower lobe.  Discontinue vancomycin.  Continue levofloxacin.  Antibiotics since January 3 of 2013.  2.  Pulmonary edema on chest x-ray.  Patient had 2-D echocardiogram yesterday with results as indicated above.  Continue diuresis on Lasix.  Patient may likely have acute diastolic heart failure which has improved with diuresis.  3. Dementia.  Patient still confused at times  stable.  4. Depression.  Stable.  5. Hyperthyroidism.  Stable.  Will check a TSH and free T4 tomorrow.  6. Urinary incontinence.  Stable.  7. Cardiomegaly.  Patient had 2-D echocardiogram with results as indicated above.  Ejection fraction of 55%.  Right ventricle and atrium mildly dilated.  8. Hypokalemia.  Likely secondary to Lasix.  Replace when necessary.  9.  Prophylaxis.  Lovenox.  10.  Disposition.  Pending.  Will have PT/OT evaluate the patient.   LOS: 2 days  Tige Meas A, MD 03/07/2011, 3:01 PM

## 2011-03-08 LAB — BASIC METABOLIC PANEL
Calcium: 9 mg/dL (ref 8.4–10.5)
GFR calc Af Amer: >90 mL/min (ref >90)
GFR calc non Af Amer: 84 mL/min — ABNORMAL LOW (ref >90)
Glucose, Bld: 129 mg/dL — ABNORMAL HIGH (ref 70–99)
Sodium: 142 mEq/L (ref 135–145)

## 2011-03-08 LAB — CBC
MCH: 29.8 pg (ref 26.0–34.0)
Platelets: 177 10*3/uL (ref 150–400)
RBC: 4.84 MIL/uL (ref 3.87–5.11)

## 2011-03-08 LAB — T4, FREE: Free T4: 1.1 ng/dL (ref 0.80–1.80)

## 2011-03-08 LAB — MAGNESIUM: Magnesium: 1.9 mg/dL (ref 1.5–2.5)

## 2011-03-08 MED ORDER — POTASSIUM CHLORIDE CRYS ER 20 MEQ PO TBCR
40.0000 meq | EXTENDED_RELEASE_TABLET | Freq: Once | ORAL | Status: AC
  Administered 2011-03-08: 40 meq via ORAL
  Filled 2011-03-08: qty 2

## 2011-03-08 NOTE — Progress Notes (Signed)
Subjective: Patient reports that her breathing was better.  Still sounded coarse.   Objective: Vital signs in last 24 hours: Filed Vitals:   03/07/11 2100 03/08/11 0245 03/08/11 0456 03/08/11 0500  BP: 139/85  110/75   Pulse: 82 71 65   Temp: 98.1 F (36.7 C)  97.7 F (36.5 C)   TempSrc:   Oral   Resp: 18 16 18    Height:      Weight:    84.2 kg (185 lb 10 oz)  SpO2: 93% 95% 92%    Weight change: -2.4 kg (-5 lb 4.7 oz)  Intake/Output Summary (Last 24 hours) at 03/08/11 1256 Last data filed at 03/08/11 0600  Gross per 24 hour  Intake    500 ml  Output      1 ml  Net    499 ml    Physical Exam: General: Awake, Oriented, No acute distress. HEENT: EOMI. Neck: Supple CV: S1 and S2 Lungs: Coarse breath sounds bilaterally, crackles at bases. Abdomen: Soft, Nontender, Nondistended, +bowel sounds. Ext: Good pulses. Trace edema.  Lab Results:  [facility] 03/08/11 0906 03/07/11 0655  NA 142 142  K 3.2* 3.1*  CL 104 104  CO2 25 26  GLUCOSE 129* 83  BUN 14 15  CREATININE 0.55 0.64  CALCIUM 9.0 8.7  MG 1.9 --  PHOS -- --    Basename 03/07/11 0655 03/06/11 0440  AST 29 34  ALT 15 11  ALKPHOS 43 40  BILITOT 0.3 0.3  PROT 7.0 6.7  ALBUMIN 2.9* 2.8*   No results found for this basename: LIPASE:2,AMYLASE:2 in the last 72 hours  Basename 03/08/11 0906 03/07/11 0655 03/05/11 1639  WBC 4.1 4.9 --  NEUTROABS -- -- 7.4  HGB 14.4 13.3 --  HCT 44.2 40.8 --  MCV 91.3 91.3 --  PLT 177 168 --   No results found for this basename: CKTOTAL:3,CKMB:3,CKMBINDEX:3,TROPONINI:3 in the last 72 hours No components found with this basename: POCBNP:3 No results found for this basename: DDIMER:2 in the last 72 hours No results found for this basename: HGBA1C:2 in the last 72 hours No results found for this basename: CHOL:2,HDL:2,LDLCALC:2,TRIG:2,CHOLHDL:2,LDLDIRECT:2 in the last 72 hours No results found for this basename: TSH,T4TOTAL,FREET3,T3FREE,THYROIDAB in the last 72 hours No  results found for this basename: VITAMINB12:2,FOLATE:2,FERRITIN:2,TIBC:2,IRON:2,RETICCTPCT:2 in the last 72 hours  Micro Results: Recent Results (from the past 240 hour(s))  URINE CULTURE     Status: Normal   Collection Time   03/05/11  5:01 PM      Component Value Range Status Comment   Specimen Description URINE, CATHETERIZED   Final    Special Requests NONE   Final    Setup Time [id number]   Final    Colony Count 75,000 COLONIES/ML   Final    Culture ESCHERICHIA COLI   Final    Report Status 03/07/2011 FINAL   Final    Organism ID, Bacteria ESCHERICHIA COLI   Final     Studies/Results: Dg Chest 2 View  03/07/2011  *RADIOLOGY REPORT*  Clinical Data: Shortness of breath, weakness  CHEST - 2 VIEW  Comparison: 03/05/2011  Findings: Possible mild interstitial edema, improved.  Mild left basilar atelectasis. No pleural effusion or pneumothorax.  Mild cardiomegaly.  Degenerative changes of the visualized thoracolumbar spine.  IMPRESSION: Mild cardiomegaly with possible mild interstitial edema, improved.  Left basilar atelectasis.  Original Report Authenticated By: Charline Bills, M.D.   Patient had a 2-D echocardiogram on 03/06/2011 Study Conclusions - Left ventricle: The cavity size  was normal. Wall thickness was increased in a pattern of mild LVH. The estimated ejection fraction was 55%. Regional wall motion abnormalities cannot be excluded. - Aortic valve: Mild regurgitation. - Aorta: There is dilitation of the ascending aorta at 46mm. - Right ventricle: The cavity size was mildly dilated. Systolic function was mildly reduced. - Right atrium: The atrium was mildly dilated.   Medications: I have reviewed the patient's current medications. Scheduled Meds:    . docusate sodium  100 mg Oral BID  . enoxaparin  40 mg Subcutaneous QHS  . furosemide  20 mg Oral BID  . levofloxacin (LEVAQUIN) IV  500 mg Intravenous Q24H  . nystatin cream   Topical BID  . potassium chloride  20 mEq Oral  Once  . vancomycin  750 mg Intravenous Q12H  . DISCONTD: furosemide  40 mg Intravenous BID   Continuous Infusions:  PRN Meds:.acetaminophen, acetaminophen, alum & mag hydroxide-simeth, levalbuterol, ondansetron (ZOFRAN) IV, ondansetron, traZODone  Assessment/Plan: 1. Pneumonia, left lower lobe.  Discontinue vancomycin.  Continue levofloxacin.  Antibiotics since January 3 of 2013.  2.  Pulmonary edema on chest x-ray.  Patient had 2-D echocardiogram on 03/06/2011 which showed ejection fraction was 55%. Continue diuresis on Lasix, likely has acute diastolic heart failure which has improved with diuresis.  3. Dementia.  Patient still confused at times, stable.  4. Depression.  Stable.  5. Hyperthyroidism.  Stable.  TSH and free T4 pending.  6. Urinary incontinence.  Stable.  7. Cardiomegaly.  Patient had 2-D echocardiogram with results as indicated above.  Ejection fraction of 55%.  Right ventricle and atrium mildly dilated.  8. Hypokalemia.  Likely secondary to Lasix.  Replace when necessary.  9.  Prophylaxis.  Lovenox.  10.  Disposition.  Pending.  PT/OT evaluation pending.   LOS: 3 days  Koltan Portocarrero A, MD 03/08/2011, 12:56 PM

## 2011-03-08 NOTE — Progress Notes (Signed)
Physical Therapy Evaluation Patient Details Name: Jillian Wilkerson MRN: [id number] DOB: 21-Mar-1924 Today's Date: 03/08/2011  Problem List:  Patient Active Problem List  Diagnoses  . Pneumonia  . Dementia  . Alzheimer disease  . Depression  . Hyperthyroidism  . Urinary incontinence  . Cardiomegaly  . Hypokalemia    Past Medical History:  Past Medical History  Diagnosis Date  . Dementia   . Alzheimer disease   . Depression   . Osteoporosis   . Hearing loss   . Urinary incontinence   . Anxiety   . Hyperthyroidism   . Asthma    Past Surgical History: History reviewed. No pertinent past surgical history.  PT Assessment/Plan/Recommendation PT Assessment Clinical Impression Statement: Patient is an 78 yo female admitted with pneumonia, with baseline dementia.  Patient resides at [facility].  Patient requires min assist for mobility/gait. Recommend patient return to SNF at discharge for continued therapy. PT Recommendation/Assessment: Patient will need skilled PT in the acute care venue PT Problem List: Decreased strength;Decreased activity tolerance;Decreased balance;Decreased mobility;Decreased cognition;Decreased knowledge of use of DME;Cardiopulmonary status limiting activity PT Therapy Diagnosis : Difficulty walking;Generalized weakness;Altered mental status PT Plan PT Frequency: Min 2X/week PT Treatment/Interventions: DME instruction;Gait training;Functional mobility training;Balance training;Therapeutic exercise;Patient/family education PT Recommendation Follow Up Recommendations: Skilled nursing facility Equipment Recommended: Defer to next venue PT Goals  Acute Rehab PT Goals PT Goal Formulation: With patient/family Time For Goal Achievement: 2 weeks Pt will go Supine/Side to Sit: with supervision PT Goal: Supine/Side to Sit - Progress: Not met Pt will go Sit to Supine/Side: with supervision PT Goal: Sit to Supine/Side - Progress: Not met Pt will go Sit to  Stand: with supervision PT Goal: Sit to Stand - Progress: Not met Pt will go Stand to Sit: with supervision PT Goal: Stand to Sit - Progress: Not met Pt will Transfer Bed to Chair/Chair to Bed: with min assist PT Transfer Goal: Bed to Chair/Chair to Bed - Progress: Not met Pt will Ambulate: 51 - 150 feet;with supervision;with rolling walker PT Goal: Ambulate - Progress: Not met  PT Evaluation Precautions/Restrictions  Precautions Precautions: Fall Required Braces or Orthoses: No Restrictions Weight Bearing Restrictions: No Prior Functioning  Home Living Lives With: Other (Comment) (at SNF) Type of Home: Skilled Nursing Facility (at [facility]) Home Layout: One level Home Access: Level entry Home Adaptive Equipment: Environmental consultant - four wheeled Prior Function Level of Independence: Needs assistance with ADLs;Needs assistance with homemaking;Needs assistance with gait Driving: No Cognition Cognition Arousal/Alertness: Awake/alert Overall Cognitive Status: History of cognitive impairments History of Cognitive Impairment: Appears at baseline functioning Orientation Level: Oriented to person;Oriented to place;Disoriented to time;Disoriented to situation Sensation/Coordination Coordination Gross Motor Movements are Fluid and Coordinated: Yes Extremity Assessment RUE Assessment RUE Assessment: Within Functional Limits LUE Assessment LUE Assessment: Within Functional Limits RLE Assessment RLE Assessment: Within Functional Limits LLE Assessment LLE Assessment: Within Functional Limits Mobility (including Balance) Bed Mobility Bed Mobility: No Transfers Transfers: Yes Sit to Stand: 4: Min assist;With upper extremity assist;With armrests;From chair/3-in-1 Sit to Stand Details (indicate cue type and reason): Verbal and tactile cues for hand placement Stand to Sit: 4: Min assist;With upper extremity assist;With armrests;To chair/3-in-1 Stand to Sit Details: Verbal cues for hand  placement and technique Ambulation/Gait Ambulation/Gait: Yes Ambulation/Gait Assistance: 4: Min assist Ambulation/Gait Assistance Details (indicate cue type and reason): Verbal cues to stand tall.  Assist for safe use of RW Ambulation Distance (Feet): 74 Feet Assistive device: Rolling walker Gait Pattern: Step-through pattern;Decreased stride length;Shuffle;Trunk  flexed  Posture/Postural Control Posture/Postural Control: Postural limitations Postural Limitations: Flexed trunk with forward head in sitting and standing Exercise    End of Session PT - End of Session Equipment Utilized During Treatment: Gait belt Activity Tolerance: Patient tolerated treatment well;Other (comment) (Limited by dyspnea on exertion - 2/4) Patient left: in chair;with call bell in reach Nurse Communication: Mobility status for ambulation General Behavior During Session: [facility] for tasks performed Cognition: Impaired, at baseline  Vena Austria 03/08/2011, 6:00 PM

## 2011-03-08 NOTE — ED Provider Notes (Signed)
I saw and evaluated the patient, reviewed the resident's note and I agree with the findings and plan.  Raeford Razor, MD 03/08/11 1051

## 2011-03-09 LAB — BASIC METABOLIC PANEL
BUN: 15 mg/dL (ref 6–23)
Calcium: 8.9 mg/dL (ref 8.4–10.5)
GFR calc Af Amer: >90 mL/min (ref >90)
GFR calc non Af Amer: 84 mL/min — ABNORMAL LOW (ref >90)
Glucose, Bld: 75 mg/dL (ref 70–99)
Sodium: 143 mEq/L (ref 135–145)

## 2011-03-09 LAB — CBC
MCH: 29.8 pg (ref 26.0–34.0)
MCV: 91.5 fL (ref 78.0–100.0)
Platelets: ADEQUATE 10*3/uL (ref 150–400)
RDW: 14.7 % (ref 11.5–15.5)
WBC: 4.8 10*3/uL (ref 4.0–10.5)

## 2011-03-09 MED ORDER — PNEUMOCOCCAL VAC POLYVALENT 25 MCG/0.5ML IJ INJ: 0.5000 mL | INJECTION | INTRAMUSCULAR | Status: DC

## 2011-03-09 MED ORDER — INFLUENZA VIRUS VACC SPLIT PF IM SUSP: 0.5000 mL | INTRAMUSCULAR | Status: DC

## 2011-03-09 MED ORDER — LEVALBUTEROL HCL 0.63 MG/3ML IN NEBU: 0.6300 mg | INHALATION_SOLUTION | Freq: Four times a day (QID) | RESPIRATORY_TRACT | Status: DC | PRN

## 2011-03-09 MED ORDER — LEVOFLOXACIN 750 MG PO TABS: 750.0000 mg | ORAL_TABLET | Freq: Every day | ORAL | Status: AC

## 2011-03-09 MED ORDER — FUROSEMIDE 20 MG PO TABS: 20.0000 mg | ORAL_TABLET | Freq: Two times a day (BID) | ORAL | Status: DC

## 2011-03-09 NOTE — Progress Notes (Signed)
Physical Therapy Treatment Patient Details Name: Jillian Wilkerson MRN: [id number] DOB: [birth year] Today's Date: 03/09/2011  PT Assessment/Plan  PT - Assessment/Plan Comments on Treatment Session: Pt with increased mobility from eval on 03/08/11.  Activity tolerance decreased by respiratory issues.  Pt did not feel that she could do more if she rested for a few minutes.  Felt she had done all she could for a while.  Pt to discharge back to SNF today. PT Plan: Discharge plan remains appropriate Follow Up Recommendations: Skilled nursing facility PT Goals  Acute Rehab PT Goals PT Goal: Supine/Side to Sit - Progress: Progressing toward goal PT Goal: Sit to Stand - Progress: Progressing toward goal PT Goal: Stand to Sit - Progress: Progressing toward goal PT Goal: Ambulate - Progress: Progressing toward goal  PT Treatment Precautions/Restrictions  Precautions Precautions: Fall Required Braces or Orthoses: No Restrictions Weight Bearing Restrictions: No Mobility (including Balance) Bed Mobility Bed Mobility: Yes Transfers Transfers: Yes Sit to Stand: 3: Mod assist Sit to Stand Details (indicate cue type and reason): Daughter present and assisted pt without giving pt time to do on her own. Stand to Sit: 4: Min assist Ambulation/Gait Ambulation/Gait: Yes Ambulation/Gait Assistance: 4: Min assist Ambulation/Gait Assistance Details (indicate cue type and reason): Decreased speed.  Daughter was pleased with how well pt was walking. Ambulation Distance (Feet): 160 Feet Assistive device: Rolling walker Gait Pattern: Decreased step length - right;Decreased step length - left Stairs: No Wheelchair Mobility Wheelchair Mobility: No   End of Session PT - End of Session Equipment Utilized During Treatment: Gait belt Activity Tolerance: Patient limited by fatigue Patient left: in chair General Behavior During Session: [facility] for tasks performed Cognition: [facility] for tasks performed  Georges Mouse 03/09/2011, 9:46 AM

## 2011-03-09 NOTE — Progress Notes (Signed)
Clinical Social Worker completed psychosocial assessment and placed in shadow chart. Pt is currently a resident at [facility] and plans to return at discharge. Clinical Social Worker confirmed with pt daughter. Clinical Social Worker contacted [facility] who stated that pt can return at discharge. Per MD, pt medically ready for discharge today. [facility] and pt daughter informed. Clinical Social Worker notified CM that pt daughter arranged home health and CM confirmed. Clinical Social Worker facilitated pt discharge needs including contacting facility and family. Pt daughter arranged for CNA to transport pt to [facility]. No further social work needs at this time.  Jacklynn Lewis, MSW, LCSWA  Clinical Social Work [phone]

## 2011-03-09 NOTE — Progress Notes (Signed)
03/09/2011 [facility], Bosie Clos [facility] Case Management Note [phone]    CARE MANAGEMENT NOTE 03/09/2011  Patient:  ARTESIA, BERKEY   Account Number:  [id number]  Date Initiated:  03/09/2011  Documentation initiated by:  Fransico Michael  Subjective/Objective Assessment:   admitted on 03/05/11 with c/o shortness of breath     Action/Plan:   prior to admission, patient was a resident at [facility].   Anticipated DC Date:  03/09/2011   Anticipated DC Plan:  ASSISTED LIVING / REST HOME      DC Planning Services  CM consult      [facility] Choice  HOME HEALTH   Choice offered to / List presented to:  C-1 Patient        HH arranged  HH-2 PT      [facility] agency  [facility]   Status of service:  Completed, signed off Medicare Important Message given?   (If response is "NO", the following Medicare IM given date fields will be blank) Date Medicare IM given:   Date Additional Medicare IM given:    Discharge Disposition:  ASSISTED LIVING  Per UR Regulation:  Reviewed for med. necessity/level of care/duration of stay  Comments:  03/09/11-1201-J.Maytal Mijangos,RN,BSN [phone]     Patient returning to [facility] with home health. Daughter chose [facility]. Spoke with Coral Spikes [phone] regarding referral. Home health PT set up. Demographics, H&P, face to face, and copies of orders faxed per request to [phone]. No further discharge needs noted at this time.

## 2011-03-09 NOTE — Discharge Summary (Signed)
DISCHARGE SUMMARY  Jillian Wilkerson  MR#: [id number]  DOB:Mar 13, 1924  Date of Admission: 03/05/2011 Date of Discharge: 03/09/2011  Attending Physician:Rembert Browe  Patient's PCP:No primary provider on file.  Consults:  NONE  Discharge Diagnoses: Present on Admission:  .Pneumonia .Dementia .Alzheimer disease .Depression .Hyperthyroidism .Urinary incontinence .Cardiomegaly    Current Discharge Medication List    START taking these medications   Details  furosemide (LASIX) 20 MG tablet Take 1 tablet (20 mg total) by mouth 2 (two) times daily. Qty: 30 tablet, Refills: 1    levalbuterol (XOPENEX) 0.63 MG/3ML nebulizer solution Take 3 mLs (0.63 mg total) by nebulization every 6 (six) hours as needed for wheezing or shortness of breath. Qty: 3 mL, Refills: 0    levofloxacin (LEVAQUIN) 750 MG tablet Take 1 tablet (750 mg total) by mouth daily. Qty: 5 tablet, Refills: 0      CONTINUE these medications which have NOT CHANGED   Details  levothyroxine (SYNTHROID, LEVOTHROID) 75 MCG tablet Take 75 mcg by mouth daily.      simvastatin (ZOCOR) 40 MG tablet Take 40 mg by mouth every evening.           Hospital Course:  Brief Admission History:  78 year old lady with h/o Dementia, Hypothyroidism, Depression from [facility], admitted for failed outpatient treatment for pneumonia, admitted to hospitalist service for management of pneumonia and worsening sob. She was initially put on iv vancomycin and Levaquin, a 2d echocardiogram is obtained , showed possible diastolic dysfunction, started her on low dose lasix. Her symptoms improved. PT evaluation was obtained recommended SNF. CSW currently working to arrange snf vs rehabilitation at the ALF.    Present on Admission:  Pneumonia: CXR on admission showed left lower lobe pneumonia, which has resolved on a repeat cxr on 1/76/12. Her antibiotics were deescalated to po Levaquin and she will be discharged on 5 more days of antibiotic  to complete the 76 day course of antibiotics.  Dementia: Patient still confused at times, stable. Continue with home meds.   .Alzheimer disease: at baseline.  Depression: at baseline. Hyper thyroidism: tsh and free T4 are within normal limits. Continue with  Synthroid.  Marland KitchenUrinary incontinence; at baseline.  Cardiomegaly and pulmonary edema on CXR:  78 d echo on 1/76/12 showed good systolic left ventricular function. She might have a component of diastolic dysfunction. She was started on lasix and her symptoms has improved dramatically on lasix. She will be discharged on a low dose lasix.    Day of Discharge BP 134/80  Pulse 74  Temp(Src) 97.8 F (36.6 C) (Oral)  Resp 20  Ht 5\' 7"  (1.702 m)  Wt 84.2 kg (185 lb 10 oz)  BMI 29.07 kg/m2  SpO2 96%  Physical Exam:  General: Awake, Oriented, No acute distress.  HEENT: EOMI.  Neck: Supple  CV: S1 and S2  Lungs: good air entry bilaterally.   Abdomen: Soft, Nontender, Nondistended, +bowel sounds.  Ext: Good pulses. Trace edema.  Results for orders placed during the hospital encounter of 03/05/11 (from the past 24 hour(s))  BASIC METABOLIC PANEL     Status: Abnormal   Collection Time   03/09/11  5:00 AM      Component Value Range   Sodium 143  135 - 145 (mEq/L)   Potassium 3.6  3.5 - 5.1 (mEq/L)   Chloride 107  96 - 112 (mEq/L)   CO2 25  19 - 32 (mEq/L)   Glucose, Bld 75  70 - 99 (mg/dL)  BUN 15  6 - 23 (mg/dL)   Creatinine, Ser 1.61  0.50 - 1.10 (mg/dL)   Calcium 8.9  8.4 - 09.6 (mg/dL)   GFR calc non Af Amer 84 (*) >90 (mL/min)   GFR calc Af Amer >90  >90 (mL/min)  MAGNESIUM     Status: Normal   Collection Time   03/09/11  5:00 AM      Component Value Range   Magnesium 2.0  1.5 - 2.5 (mg/dL)  CBC     Status: Normal   Collection Time   03/09/11  5:00 AM      Component Value Range   WBC 4.8  4.0 - 10.5 (K/uL)   RBC 4.59  3.87 - 5.11 (MIL/uL)   Hemoglobin 13.7  12.0 - 15.0 (g/dL)   HCT 04.5  40.9 - 81.1 (%)   MCV 91.5  78.0 -  100.0 (fL)   MCH 29.8  26.0 - 34.0 (pg)   MCHC 32.6  30.0 - 36.0 (g/dL)   RDW 91.4  78.2 - 95.6 (%)   Platelets    150 - 400 (K/uL)   Value: PLATELET CLUMPS NOTED ON SMEAR, COUNT APPEARS ADEQUATE    Disposition: Discharge to ALF with rehabilitation vs SNF. CSW is working on disposition.    Follow-up Appts: Discharge Orders    Future Orders Please Complete By Expires   Diet - low sodium heart healthy      Increase activity slowly      Discharge instructions      Comments:   Follow up with PCP AS NEEDED IN 1 TO 2 WEEKS.      Follow-up with pcp in on e to 2 weeks.   Tests Needing Follow-up:CXR in 2 to 4 weeks.  Time spent in discharge (includes decision making & examination of pt): 52 minutes  Signed: Ziyan Hillmer 03/09/2011, 11:59 AM

## 2012-05-26 ENCOUNTER — Emergency Department (HOSPITAL_COMMUNITY): Payer: Medicare Other

## 2012-05-26 ENCOUNTER — Emergency Department (HOSPITAL_COMMUNITY)
Admission: EM | Admit: 2012-05-26 | Discharge: 2012-05-26 | Disposition: A | Discharge status: Skilled Nursing Facility | Payer: Medicare Other | Attending: Emergency Medicine | Admitting: Emergency Medicine

## 2012-05-26 ENCOUNTER — Encounter (HOSPITAL_COMMUNITY): Payer: Self-pay

## 2012-05-26 DIAGNOSIS — F0390 Unspecified dementia without behavioral disturbance: Secondary | ICD-10-CM | POA: Insufficient documentation

## 2012-05-26 DIAGNOSIS — F0280 Dementia in other diseases classified elsewhere without behavioral disturbance: Secondary | ICD-10-CM | POA: Insufficient documentation

## 2012-05-26 DIAGNOSIS — S0990XA Unspecified injury of head, initial encounter: Secondary | ICD-10-CM

## 2012-05-26 DIAGNOSIS — Z79899 Other long term (current) drug therapy: Secondary | ICD-10-CM | POA: Insufficient documentation

## 2012-05-26 DIAGNOSIS — Z7982 Long term (current) use of aspirin: Secondary | ICD-10-CM | POA: Insufficient documentation

## 2012-05-26 DIAGNOSIS — Z8739 Personal history of other diseases of the musculoskeletal system and connective tissue: Secondary | ICD-10-CM | POA: Insufficient documentation

## 2012-05-26 DIAGNOSIS — R296 Repeated falls: Secondary | ICD-10-CM | POA: Insufficient documentation

## 2012-05-26 DIAGNOSIS — E039 Hypothyroidism, unspecified: Secondary | ICD-10-CM | POA: Insufficient documentation

## 2012-05-26 DIAGNOSIS — Y939 Activity, unspecified: Secondary | ICD-10-CM | POA: Insufficient documentation

## 2012-05-26 DIAGNOSIS — F329 Major depressive disorder, single episode, unspecified: Secondary | ICD-10-CM | POA: Insufficient documentation

## 2012-05-26 DIAGNOSIS — H9190 Unspecified hearing loss, unspecified ear: Secondary | ICD-10-CM | POA: Insufficient documentation

## 2012-05-26 DIAGNOSIS — W19XXXA Unspecified fall, initial encounter: Secondary | ICD-10-CM

## 2012-05-26 DIAGNOSIS — F3289 Other specified depressive episodes: Secondary | ICD-10-CM | POA: Insufficient documentation

## 2012-05-26 DIAGNOSIS — Z87448 Personal history of other diseases of urinary system: Secondary | ICD-10-CM | POA: Insufficient documentation

## 2012-05-26 DIAGNOSIS — Y9210 Unspecified residential institution as the place of occurrence of the external cause: Secondary | ICD-10-CM | POA: Insufficient documentation

## 2012-05-26 DIAGNOSIS — F411 Generalized anxiety disorder: Secondary | ICD-10-CM | POA: Insufficient documentation

## 2012-05-26 DIAGNOSIS — G309 Alzheimer's disease, unspecified: Secondary | ICD-10-CM | POA: Insufficient documentation

## 2012-05-26 DIAGNOSIS — J45909 Unspecified asthma, uncomplicated: Secondary | ICD-10-CM | POA: Insufficient documentation

## 2012-05-26 NOTE — ED Notes (Signed)
Per EMS/Nursing home facility pateint was going to sit in the chair, and missed chair landed on bottom. No injuries or complaints noted, denied neck or back pain. Did not hit head, denies LOC.

## 2012-05-26 NOTE — ED Notes (Signed)
Off floor for testing 

## 2012-05-26 NOTE — ED Notes (Signed)
ZOX:[id number]<AV> Expected date:<BR> Expected time:<BR> Means of arrival:<BR> Comments:<BR> Fall, hit head, no complaints

## 2012-05-26 NOTE — ED Notes (Signed)
Called [facility] for transport back to facility 

## 2012-05-26 NOTE — ED Provider Notes (Signed)
History     CSN: [id number]  Arrival date & time 05/26/12  0903   First MD Initiated Contact with Patient 05/26/12 [phone]      Chief Complaint  Patient presents with  . Fall    (Consider location/radiation/quality/duration/timing/severity/associated sxs/prior treatment) The history is provided by the patient, the nursing home and the EMS personnel. The history is limited by the condition of the patient.  Jillian Wilkerson is a 79 y.o. female history of dementia, depression, incontinence here presenting with fall. She says she was standing and trying to sit down on a chair and missed the chair and fell on her buttocks. Per nursing home there may be head injury but she denies any head injury or loss of consciousness or syncope.  She said her right hip hurts a little but denies any back or neck pain. Denies any chest pain or shortness of breath. On baby asa, not on coumadin or plavix.    Level V caveat- dementia   Past Medical History  Diagnosis Date  . Dementia   . Alzheimer disease   . Depression   . Osteoporosis   . Hearing loss   . Urinary incontinence   . Anxiety   . Hyperthyroidism   . Asthma     History reviewed. No pertinent past surgical history.  No family history on file.  History  Substance Use Topics  . Smoking status: Never Smoker   . Smokeless tobacco: Not on file  . Alcohol Use: No    OB History   Grav Para Term Preterm Abortions TAB SAB Ect Mult Living                  Review of Systems  Musculoskeletal:       R hip pain   All other systems reviewed and are negative.    Allergies  Codeine and Penicillins  Home Medications   Current Outpatient Rx  Name  Route  Sig  Dispense  Refill  . acetaminophen (TYLENOL) 500 MG tablet   Oral   Take 500 mg by mouth every 6 (six) hours as needed for pain.         Marland Kitchen albuterol (PROVENTIL) (2.5 MG/3ML) 0.083% nebulizer solution   Nebulization   Take 2.5 mg by nebulization every 6 (six) hours as needed  for wheezing.         Marland Kitchen alendronate (FOSAMAX) 70 MG tablet   Oral   Take 70 mg by mouth every 7 (seven) days. Take with a full glass of water on an empty stomach.         Marland Kitchen aspirin EC 81 MG tablet   Oral   Take 81 mg by mouth daily.         . benzonatate (TESSALON) 100 MG capsule   Oral   Take 100 mg by mouth 3 (three) times daily as needed for cough.         . docusate sodium (COLACE) 100 MG capsule   Oral   Take 100 mg by mouth 2 (two) times daily.         Marland Kitchen donepezil (ARICEPT) 10 MG tablet   Oral   Take 10 mg by mouth at bedtime.         Marland Kitchen escitalopram (LEXAPRO) 10 MG tablet   Oral   Take 10 mg by mouth every morning.         . furosemide (LASIX) 20 MG tablet   Oral   Take 20 mg by mouth  2 (two) times daily.         Marland Kitchen GLUCOSAMINE CHONDROITIN COMPLX PO   Oral   Take 1 tablet by mouth 3 (three) times daily.         Marland Kitchen levothyroxine (SYNTHROID, LEVOTHROID) 75 MCG tablet   Oral   Take 75 mcg by mouth every morning.         . loperamide (IMODIUM) 2 MG capsule   Oral   Take 2 mg by mouth 4 (four) times daily as needed for diarrhea or loose stools.         . metoprolol tartrate (LOPRESSOR) 25 MG tablet   Oral   Take 12.5 mg by mouth 2 (two) times daily.         . Vitamin D, Ergocalciferol, (DRISDOL) 50000 UNITS CAPS   Oral   Take 50,000 Units by mouth every 7 (seven) days.           BP 156/79  Pulse 60  Temp(Src) 97.8 F (36.6 C) (Oral)  SpO2 95%  Physical Exam  Nursing note and vitals reviewed. Constitutional: She appears well-developed.  Old, NAD, pleasant lady   HENT:  Head: Normocephalic.  Mouth/Throat: Oropharynx is clear and moist.  No obvious scalp hematoma   Eyes: Conjunctivae are normal. Pupils are equal, round, and reactive to light.  Neck: Normal range of motion.  No midline tenderness, nl ROM   Pulmonary/Chest: Effort normal and breath sounds normal. No respiratory distress. She has no wheezes. She has no rales.   Abdominal: Soft. Bowel sounds are normal. She exhibits no distension. There is no tenderness. There is no rebound and no guarding.  Musculoskeletal:  No midline back tenderness or step off. Mild R buttuck tenderness but nl ROM hips bilaterally. Able to ambulate with assistance (baseline).   Neurological: She is alert.  Demented, nl strength throughout.   Skin: Skin is warm and dry.  Psychiatric: She has a normal mood and affect. Her behavior is normal. Judgment and thought content normal.    ED Course  Procedures (including critical care time)  Labs Reviewed - No data to display Dg Chest 2 View  05/26/2012  *RADIOLOGY REPORT*  Clinical Data: History of injury from fall.  History of cardiomegaly.  CHEST - 2 VIEW  Comparison: 03/07/2011.  Findings: There is enlargement of the cardiac silhouette which appears larger than on prior study. Ectasia and nonaneurysmal calcification of the thoracic aorta are seen.  Mediastinal and hilar contours appear stable. Slight increase in perihilar markings and central peribronchial thickening are present on lateral.  No consolidation or pleural effusion is evident.  There is osteopenic appearance of the bones.  Changes of degenerative disc disease and degenerative spondylosis are present. No fracture is evident.  IMPRESSION: Enlargement of the cardiac silhouette which appears larger than on prior study. Slight increase in perihilar markings and central peribronchial thickening are present on lateral.  No consolidation or pleural effusion is evident. No fracture is evident.   Original Report Authenticated By: Onalee Hua Call    Dg Pelvis 1-2 Views  05/26/2012  *RADIOLOGY REPORT*  Clinical Data: 79 year old female status post fall with right side pain.  Prior hip surgery.  PELVIS - 1-2 VIEW  Comparison: Right femur series 10/17/2008.  Findings: Visible right hip and femur ORIF hardware appears stable since 2010.  Grossly stable and normal alignment of the right hip  arthroplasty components.  Visible right femur intact.  Pelvis and SI joints appear stable.  Stable and grossly intact proximal left  femur.  IMPRESSION: No acute fracture or dislocation identified about the right hip or pelvis.  Stable visible right femur hardware since 2010.   Original Report Authenticated By: Erskine Speed, M.D.    Ct Head Wo Contrast  05/26/2012  *RADIOLOGY REPORT*  Clinical Data:  Status post fall with a blow to the back of the head.  Headache and neck pain.  CT HEAD WITHOUT CONTRAST CT CERVICAL SPINE WITHOUT CONTRAST  Technique:  Multidetector CT imaging of the head and cervical spine was performed following the standard protocol without intravenous contrast.  Multiplanar CT image reconstructions of the cervical spine were also generated.  Comparison:  Head CT scan 03/05/2011 and head and cervical spine CT scan 10/17/2008.  CT HEAD  Findings: Cortical atrophy and chronic microvascular ischemic change are again seen.  No evidence of acute intracranial abnormality including infarct, hemorrhage, mass lesion, mass effect, midline shift or abnormal extra-axial fluid collection is identified.  Mucosal thickening is noted in the right maxillary sinus and scattered ethmoid air cells.  Concha bullosa on the right is seen.  There is mucosal thickening in the periphery of the left sphenoid sinus.  The calvarium is intact.  IMPRESSION:  1.  No acute intracranial abnormality. 2.  Sinus disease. 3.  Atrophy and chronic microvascular ischemic change.  CT CERVICAL SPINE  Findings: Again seen is ankylosis of the C3-4 and C4-5 facets with multilevel facet arthropathy identified.  Trace anterolisthesis of C4 on C5 is unchanged.  Multilevel loss of disc space height is seen.  There is no fracture or traumatic subluxation.  Lung apices are clear.  IMPRESSION: No acute finding.  Multilevel degenerative disease.   Original Report Authenticated By: Holley Dexter, M.D.    Ct Cervical Spine Wo Contrast  05/26/2012   *RADIOLOGY REPORT*  Clinical Data:  Status post fall with a blow to the back of the head.  Headache and neck pain.  CT HEAD WITHOUT CONTRAST CT CERVICAL SPINE WITHOUT CONTRAST  Technique:  Multidetector CT imaging of the head and cervical spine was performed following the standard protocol without intravenous contrast.  Multiplanar CT image reconstructions of the cervical spine were also generated.  Comparison:  Head CT scan 03/05/2011 and head and cervical spine CT scan 10/17/2008.  CT HEAD  Findings: Cortical atrophy and chronic microvascular ischemic change are again seen.  No evidence of acute intracranial abnormality including infarct, hemorrhage, mass lesion, mass effect, midline shift or abnormal extra-axial fluid collection is identified.  Mucosal thickening is noted in the right maxillary sinus and scattered ethmoid air cells.  Concha bullosa on the right is seen.  There is mucosal thickening in the periphery of the left sphenoid sinus.  The calvarium is intact.  IMPRESSION:  1.  No acute intracranial abnormality. 2.  Sinus disease. 3.  Atrophy and chronic microvascular ischemic change.  CT CERVICAL SPINE  Findings: Again seen is ankylosis of the C3-4 and C4-5 facets with multilevel facet arthropathy identified.  Trace anterolisthesis of C4 on C5 is unchanged.  Multilevel loss of disc space height is seen.  There is no fracture or traumatic subluxation.  Lung apices are clear.  IMPRESSION: No acute finding.  Multilevel degenerative disease.   Original Report Authenticated By: Holley Dexter, M.D.      No diagnosis found.    MDM  Jillian Wilkerson is a 79 y.o. female here with s/p fall. Will get CT head/neck to r/o bleed vs fracture. Will get pelvis xray to r/o hip vs pelvis fracture.  Comfortable, refused pain meds.   10:20 AM CT head/neck and CXR and pelvis xray unremarkable. Stable to d/c back to facility.         Richardean Canal, MD 05/26/12 1020

## 2012-05-26 NOTE — ED Notes (Signed)
[facility] cancelled-family will transport home

## 2012-10-31 ENCOUNTER — Encounter (HOSPITAL_COMMUNITY): Payer: Self-pay | Admitting: Radiology

## 2012-10-31 ENCOUNTER — Other Ambulatory Visit: Payer: Self-pay

## 2012-10-31 ENCOUNTER — Emergency Department (HOSPITAL_COMMUNITY): Payer: Medicare Other

## 2012-10-31 ENCOUNTER — Emergency Department (HOSPITAL_COMMUNITY)
Admission: EM | Admit: 2012-10-31 | Discharge: 2012-10-31 | Disposition: A | Discharge status: Skilled Nursing Facility | Payer: Medicare Other | Attending: Emergency Medicine | Admitting: Emergency Medicine

## 2012-10-31 DIAGNOSIS — G309 Alzheimer's disease, unspecified: Secondary | ICD-10-CM | POA: Insufficient documentation

## 2012-10-31 DIAGNOSIS — Z880 Allergy status to penicillin: Secondary | ICD-10-CM | POA: Insufficient documentation

## 2012-10-31 DIAGNOSIS — F3289 Other specified depressive episodes: Secondary | ICD-10-CM | POA: Insufficient documentation

## 2012-10-31 DIAGNOSIS — F329 Major depressive disorder, single episode, unspecified: Secondary | ICD-10-CM | POA: Insufficient documentation

## 2012-10-31 DIAGNOSIS — J45909 Unspecified asthma, uncomplicated: Secondary | ICD-10-CM | POA: Insufficient documentation

## 2012-10-31 DIAGNOSIS — Z7982 Long term (current) use of aspirin: Secondary | ICD-10-CM | POA: Insufficient documentation

## 2012-10-31 DIAGNOSIS — Z79899 Other long term (current) drug therapy: Secondary | ICD-10-CM | POA: Insufficient documentation

## 2012-10-31 DIAGNOSIS — F0280 Dementia in other diseases classified elsewhere without behavioral disturbance: Secondary | ICD-10-CM | POA: Insufficient documentation

## 2012-10-31 DIAGNOSIS — Y9389 Activity, other specified: Secondary | ICD-10-CM | POA: Insufficient documentation

## 2012-10-31 DIAGNOSIS — Y9210 Unspecified residential institution as the place of occurrence of the external cause: Secondary | ICD-10-CM | POA: Insufficient documentation

## 2012-10-31 DIAGNOSIS — M810 Age-related osteoporosis without current pathological fracture: Secondary | ICD-10-CM | POA: Insufficient documentation

## 2012-10-31 DIAGNOSIS — F411 Generalized anxiety disorder: Secondary | ICD-10-CM | POA: Insufficient documentation

## 2012-10-31 DIAGNOSIS — W010XXA Fall on same level from slipping, tripping and stumbling without subsequent striking against object, initial encounter: Secondary | ICD-10-CM | POA: Insufficient documentation

## 2012-10-31 DIAGNOSIS — T07XXXA Unspecified multiple injuries, initial encounter: Secondary | ICD-10-CM

## 2012-10-31 LAB — CBC WITH DIFFERENTIAL/PLATELET
Basophils Relative: 1 % (ref 0–1)
Eosinophils Absolute: 0.2 10*3/uL (ref 0.0–0.7)
Eosinophils Relative: 2 % (ref 0–5)
Hemoglobin: 14.1 g/dL (ref 12.0–15.0)
Lymphs Abs: 1.1 10*3/uL (ref 0.7–4.0)
MCH: 29.8 pg (ref 26.0–34.0)
MCHC: 32.9 g/dL (ref 30.0–36.0)
MCV: 90.5 fL (ref 78.0–100.0)
Monocytes Relative: 7 % (ref 3–12)
Platelets: 210 10*3/uL (ref 150–400)
RBC: 4.73 MIL/uL (ref 3.87–5.11)

## 2012-10-31 LAB — BASIC METABOLIC PANEL
BUN: 11 mg/dL (ref 6–23)
Calcium: 9 mg/dL (ref 8.4–10.5)
GFR calc Af Amer: >90 mL/min (ref >90)
GFR calc non Af Amer: 84 mL/min — ABNORMAL LOW (ref >90)
Glucose, Bld: 100 mg/dL — ABNORMAL HIGH (ref 70–99)

## 2012-10-31 NOTE — ED Notes (Addendum)
Per EMS patient from facility was walking with walker to bathroom that patient tripped to left and fell to the ground, witnessed by staff. Per EMS staff states that patient did not hit her head or lose consciousness. Per EMS patient has a GCS of 15 at baseline. Patient is currently wearing c-collar and states pain is 2/10.

## 2012-10-31 NOTE — ED Notes (Signed)
MD at bedside. 

## 2012-10-31 NOTE — ED Provider Notes (Signed)
CSN: [id number]     Arrival date & time 10/31/12  4098 History   First MD Initiated Contact with Patient 10/31/12 0840     No chief complaint on file.   HPI   Patient has a history of Alzheimer's.  Resides at [facility].  He witnessed fall this morning. Background. On her left side. Did not strike her head. She was immobilized with cervical collar and transferred here. She does not recall the incident. She denies any pain.  Per staff report to paramedics she was at her baseline for her cognition. Past Medical History  Diagnosis Date  . Dementia   . Alzheimer disease   . Depression   . Osteoporosis   . Hearing loss   . Urinary incontinence   . Anxiety   . Hyperthyroidism   . Asthma    No past surgical history on file. No family history on file. History  Substance Use Topics  . Smoking status: Never Smoker   . Smokeless tobacco: Not on file  . Alcohol Use: No   OB History   Grav Para Term Preterm Abortions TAB SAB Ect Mult Living                 Review of Systems  Unable to perform ROS: Dementia    Allergies  Codeine and Penicillins  Home Medications   Current Outpatient Rx  Name  Route  Sig  Dispense  Refill  . acetaminophen (TYLENOL) 500 MG tablet   Oral   Take 500 mg by mouth every 6 (six) hours as needed for pain.         Marland Kitchen albuterol (PROVENTIL) (2.5 MG/3ML) 0.083% nebulizer solution   Nebulization   Take 2.5 mg by nebulization every 6 (six) hours as needed for wheezing.         Marland Kitchen alendronate (FOSAMAX) 70 MG tablet   Oral   Take 70 mg by mouth every 7 (seven) days. Take with a full glass of water on an empty stomach.         Marland Kitchen alum & mag hydroxide-simeth (MAALOX/MYLANTA) 200-200-20 MG/5ML suspension   Oral   Take 30 mLs by mouth every 6 (six) hours as needed for indigestion.         Marland Kitchen aspirin EC 81 MG tablet   Oral   Take 81 mg by mouth daily.         . benzonatate (TESSALON) 100 MG capsule   Oral   Take 100 mg by mouth 3 (three) times  daily as needed for cough.         . docusate sodium (COLACE) 100 MG capsule   Oral   Take 100 mg by mouth 2 (two) times daily.         Marland Kitchen donepezil (ARICEPT) 10 MG tablet   Oral   Take 10 mg by mouth at bedtime.         Marland Kitchen escitalopram (LEXAPRO) 20 MG tablet   Oral   Take 20 mg by mouth daily.         . furosemide (LASIX) 20 MG tablet   Oral   Take 20 mg by mouth 2 (two) times daily.         Marland Kitchen GLUCOSAMINE CHONDROITIN COMPLX PO   Oral   Take 1 tablet by mouth 3 (three) times daily.         Marland Kitchen guaiFENesin (ROBITUSSIN) 100 MG/5ML SOLN   Oral   Take 10 mLs by mouth every 4 (four)  hours as needed.         Marland Kitchen levothyroxine (SYNTHROID, LEVOTHROID) 75 MCG tablet   Oral   Take 75 mcg by mouth every morning.         . loperamide (IMODIUM) 2 MG capsule   Oral   Take 2 mg by mouth 4 (four) times daily as needed for diarrhea or loose stools.         . magnesium hydroxide (MILK OF MAGNESIA) 400 MG/5ML suspension   Oral   Take 30 mLs by mouth at bedtime as needed for constipation.         . Memantine HCl ER (NAMENDA XR) 28 MG CP24   Oral   Take 1 capsule by mouth daily.         . metoprolol tartrate (LOPRESSOR) 25 MG tablet   Oral   Take 12.5 mg by mouth 2 (two) times daily.         . Vitamin D, Ergocalciferol, (DRISDOL) 50000 UNITS CAPS   Oral   Take 50,000 Units by mouth every 7 (seven) days.          BP 152/69  Pulse 69  Temp(Src) 98.8 F (37.1 C) (Oral)  Resp 16  SpO2 94% Physical Exam  Constitutional: She is oriented to person, place, and time. She appears well-developed and well-nourished. No distress.  She is awake she is alert she answers simple questions very appropriately.  She does not remember the fall  HENT:  Head: Normocephalic.    No blood of the TMs, and over the mastoids, no ears nose or mouth,  Eyes: Conjunctivae are normal. Pupils are equal, round, and reactive to light. No scleral icterus.  Equal reactive pupils   Neck:  Normal range of motion. Neck supple. No thyromegaly present.  No complaint of pain with palpation over the neck  Cardiovascular: Normal rate and regular rhythm.  Exam reveals no gallop and no friction rub.   No murmur heard. Regular rhythm  Pulmonary/Chest: Effort normal and breath sounds normal. No respiratory distress. She has no wheezes. She has no rales.  Clear lungs  Abdominal: Soft. Bowel sounds are normal. She exhibits no distension. There is no tenderness. There is no rebound.  Musculoskeletal: Normal range of motion.  Neurological: She is alert and oriented to person, place, and time.  Normal range of motion and use of the upper extremities is able to flex extend at the lower legs. No apparent neurological deficits. Symmetric smile. Cranial nerves intact symmetric  Skin: Skin is warm and dry. No rash noted.  Psychiatric: She has a normal mood and affect. Her behavior is normal.    ED Course  Procedures (including critical care time) Labs Review  EKG: Indication is fall. Interpretation sinus rhythm. First-degree AV block. Inferior Q waves. All unchanged versus 03/05/2011 Labs Reviewed  CBC WITH DIFFERENTIAL - Abnormal; Notable for the following:    WBC 11.1 (*)    Neutrophils Relative % 81 (*)    Neutro Abs 9.0 (*)    Lymphocytes Relative 10 (*)    All other components within normal limits  BASIC METABOLIC PANEL - Abnormal; Notable for the following:    Glucose, Bld 100 (*)    GFR calc non Af Amer 84 (*)    All other components within normal limits   Imaging Review Ct Head Wo Contrast  10/31/2012   *RADIOLOGY REPORT*  Clinical Data:  Fall with headache and neck pain.  CT HEAD WITHOUT CONTRAST CT CERVICAL SPINE WITHOUT  CONTRAST  Technique:  Multidetector CT imaging of the head and cervical spine was performed following the standard protocol without intravenous contrast.  Multiplanar CT image reconstructions of the cervical spine were also generated.  Comparison:  05/26/2012   CT HEAD  Findings: Stable advanced atrophy and small vessel ischemic changes in the periventricular white matter.  The brain demonstrates no evidence of hemorrhage, infarction, edema, mass effect, extra-axial fluid collection, hydrocephalus or mass lesion.  The skull is unremarkable.  IMPRESSION: No acute findings.  Stable advanced atrophy and small vessel disease.  CT CERVICAL SPINE  Findings: Stable advanced spondylosis at the C5-6 and C6-7 levels. There is stable mild anterolisthesis of C4 on C5 of approximately 2- 3 mm.  Stable loss of lordosis.  No fracture is identified.  No soft tissue swelling or hematoma is seen.  The visualized airway is normally patent.  IMPRESSION: No evidence of acute cervical fracture.  Stable advanced cervical spondylosis.   Original Report Authenticated By: Irish Lack, M.D.   Ct Cervical Spine Wo Contrast  10/31/2012   *RADIOLOGY REPORT*  Clinical Data:  Fall with headache and neck pain.  CT HEAD WITHOUT CONTRAST CT CERVICAL SPINE WITHOUT CONTRAST  Technique:  Multidetector CT imaging of the head and cervical spine was performed following the standard protocol without intravenous contrast.  Multiplanar CT image reconstructions of the cervical spine were also generated.  Comparison:  05/26/2012  CT HEAD  Findings: Stable advanced atrophy and small vessel ischemic changes in the periventricular white matter.  The brain demonstrates no evidence of hemorrhage, infarction, edema, mass effect, extra-axial fluid collection, hydrocephalus or mass lesion.  The skull is unremarkable.  IMPRESSION: No acute findings.  Stable advanced atrophy and small vessel disease.  CT CERVICAL SPINE  Findings: Stable advanced spondylosis at the C5-6 and C6-7 levels. There is stable mild anterolisthesis of C4 on C5 of approximately 2- 3 mm.  Stable loss of lordosis.  No fracture is identified.  No soft tissue swelling or hematoma is seen.  The visualized airway is normally patent.  IMPRESSION: No  evidence of acute cervical fracture.  Stable advanced cervical spondylosis.   Original Report Authenticated By: Irish Lack, M.D.    MDM   1. Multiple contusions    A curious ear. She is mentating at her baseline. Studies show no acute abnormalities. Plan discharge back to [facility] house her care facility.    Claudean Kinds, MD 10/31/12 [phone]

## 2012-10-31 NOTE — ED Notes (Signed)
Patient's daughter is on phone, states that she will try to get here but lives far away and so will send a home care RN to help. Per daughter patient is in final stages of alzheimer's disease and lives at [facility]. Daughter's phone number is [phone] and the name is Sharl Ma. Daughter states that staff told her the patient did hit her head and that she has a history of falling and hitting head.

## 2012-10-31 NOTE — ED Notes (Signed)
Bed: WA12 Expected date:  Expected time:  Means of arrival:  Comments: fall 

## 2012-11-10 ENCOUNTER — Encounter (HOSPITAL_COMMUNITY): Payer: Self-pay | Admitting: *Deleted

## 2012-11-10 ENCOUNTER — Emergency Department (HOSPITAL_COMMUNITY)
Admission: EM | Admit: 2012-11-10 | Discharge: 2012-11-10 | Disposition: A | Discharge status: Skilled Nursing Facility | Payer: Medicare Other | Attending: Emergency Medicine | Admitting: Emergency Medicine

## 2012-11-10 ENCOUNTER — Emergency Department (HOSPITAL_COMMUNITY): Payer: Medicare Other

## 2012-11-10 DIAGNOSIS — E0590 Thyrotoxicosis, unspecified without thyrotoxic crisis or storm: Secondary | ICD-10-CM | POA: Insufficient documentation

## 2012-11-10 DIAGNOSIS — Z8679 Personal history of other diseases of the circulatory system: Secondary | ICD-10-CM | POA: Insufficient documentation

## 2012-11-10 DIAGNOSIS — G8929 Other chronic pain: Secondary | ICD-10-CM | POA: Insufficient documentation

## 2012-11-10 DIAGNOSIS — Z7983 Long term (current) use of bisphosphonates: Secondary | ICD-10-CM | POA: Insufficient documentation

## 2012-11-10 DIAGNOSIS — G309 Alzheimer's disease, unspecified: Secondary | ICD-10-CM | POA: Insufficient documentation

## 2012-11-10 DIAGNOSIS — M810 Age-related osteoporosis without current pathological fracture: Secondary | ICD-10-CM | POA: Insufficient documentation

## 2012-11-10 DIAGNOSIS — H9190 Unspecified hearing loss, unspecified ear: Secondary | ICD-10-CM | POA: Insufficient documentation

## 2012-11-10 DIAGNOSIS — F3289 Other specified depressive episodes: Secondary | ICD-10-CM | POA: Insufficient documentation

## 2012-11-10 DIAGNOSIS — Z880 Allergy status to penicillin: Secondary | ICD-10-CM | POA: Insufficient documentation

## 2012-11-10 DIAGNOSIS — N390 Urinary tract infection, site not specified: Secondary | ICD-10-CM | POA: Insufficient documentation

## 2012-11-10 DIAGNOSIS — F0280 Dementia in other diseases classified elsewhere without behavioral disturbance: Secondary | ICD-10-CM | POA: Insufficient documentation

## 2012-11-10 DIAGNOSIS — Z7982 Long term (current) use of aspirin: Secondary | ICD-10-CM | POA: Insufficient documentation

## 2012-11-10 DIAGNOSIS — K921 Melena: Secondary | ICD-10-CM | POA: Insufficient documentation

## 2012-11-10 DIAGNOSIS — F329 Major depressive disorder, single episode, unspecified: Secondary | ICD-10-CM | POA: Insufficient documentation

## 2012-11-10 DIAGNOSIS — F411 Generalized anxiety disorder: Secondary | ICD-10-CM | POA: Insufficient documentation

## 2012-11-10 DIAGNOSIS — M549 Dorsalgia, unspecified: Secondary | ICD-10-CM | POA: Insufficient documentation

## 2012-11-10 DIAGNOSIS — Z79899 Other long term (current) drug therapy: Secondary | ICD-10-CM | POA: Insufficient documentation

## 2012-11-10 HISTORY — DX: Rheumatic mitral valve disease, unspecified: I05.9

## 2012-11-10 HISTORY — DX: Age-related osteoporosis without current pathological fracture: M81.0

## 2012-11-10 HISTORY — DX: Unspecified urinary incontinence: R32

## 2012-11-10 HISTORY — DX: Dementia in other diseases classified elsewhere, unspecified severity, without behavioral disturbance, psychotic disturbance, mood disturbance, and anxiety: F02.80

## 2012-11-10 HISTORY — DX: Unspecified hearing loss, unspecified ear: H91.90

## 2012-11-10 HISTORY — DX: Dorsalgia, unspecified: M54.9

## 2012-11-10 HISTORY — DX: Nonrheumatic mitral (valve) prolapse: I34.1

## 2012-11-10 HISTORY — DX: Alzheimer's disease, unspecified: G30.9

## 2012-11-10 HISTORY — DX: Unsteadiness on feet: R26.81

## 2012-11-10 HISTORY — DX: Other chronic pain: G89.29

## 2012-11-10 LAB — COMPREHENSIVE METABOLIC PANEL
ALT: 10 U/L (ref 0–35)
AST: 12 U/L (ref 0–37)
Albumin: 3 g/dL — ABNORMAL LOW (ref 3.5–5.2)
Alkaline Phosphatase: 76 U/L (ref 39–117)
CO2: 24 mEq/L (ref 19–32)
Chloride: 104 mEq/L (ref 96–112)
Creatinine, Ser: 0.48 mg/dL — ABNORMAL LOW (ref 0.50–1.10)
GFR calc non Af Amer: 87 mL/min — ABNORMAL LOW (ref >90)
Potassium: 3.4 mEq/L — ABNORMAL LOW (ref 3.5–5.1)
Total Bilirubin: 0.2 mg/dL — ABNORMAL LOW (ref 0.3–1.2)

## 2012-11-10 LAB — ABO/RH: ABO/RH(D): A NEG

## 2012-11-10 LAB — CBC
MCV: 90.3 fL (ref 78.0–100.0)
Platelets: 257 10*3/uL (ref 150–400)
RBC: 4.65 MIL/uL (ref 3.87–5.11)
RDW: 14 % (ref 11.5–15.5)
WBC: 8.7 10*3/uL (ref 4.0–10.5)

## 2012-11-10 LAB — URINALYSIS W MICROSCOPIC + REFLEX CULTURE
Bilirubin Urine: NEGATIVE
Nitrite: POSITIVE — AB
Specific Gravity, Urine: 1.025 (ref 1.005–1.030)
Urobilinogen, UA: 1 mg/dL (ref 0.0–1.0)
pH: 5 (ref 5.0–8.0)

## 2012-11-10 LAB — LIPASE, BLOOD: Lipase: 72 U/L — ABNORMAL HIGH (ref 11–59)

## 2012-11-10 LAB — TYPE AND SCREEN: Antibody Screen: NEGATIVE

## 2012-11-10 MED ORDER — CIPROFLOXACIN HCL 500 MG PO TABS: 500.0000 mg | ORAL_TABLET | Freq: Two times a day (BID) | ORAL | Status: DC

## 2012-11-10 MED ORDER — CIPROFLOXACIN HCL 500 MG PO TABS
500.0000 mg | ORAL_TABLET | Freq: Once | ORAL | Status: AC
  Administered 2012-11-10: 500 mg via ORAL
  Filled 2012-11-10: qty 1

## 2012-11-10 MED ORDER — IOHEXOL 300 MG/ML  SOLN
50.0000 mL | Freq: Once | INTRAMUSCULAR | Status: AC | PRN
  Administered 2012-11-10: 50 mL via ORAL

## 2012-11-10 MED ORDER — IOHEXOL 300 MG/ML  SOLN
100.0000 mL | Freq: Once | INTRAMUSCULAR | Status: AC | PRN
  Administered 2012-11-10: 100 mL via INTRAVENOUS

## 2012-11-10 NOTE — ED Provider Notes (Signed)
CSN: [id number]     Arrival date & time 11/10/12  1358 History   First MD Initiated Contact with Patient 11/10/12 1516     Chief Complaint  Patient presents with  . Rectal Bleeding   Patient is a 79 y.o. female presenting with hematochezia. The history is provided by the EMS personnel, the nursing home and a relative. The history is limited by the condition of the patient (Hx dementia).  Rectal Bleeding Pt was seen at 1520. Per EMS, NH report, and family, c/o pt with one episode of rectal bleeding that occurred this morning. Pt herself has hx of dementia and denies any complaints when asked. Denies CP/SOB, no abd pain, no N/V/D, no fevers.    Past Medical History  Diagnosis Date  . Alzheimer's dementia   . Chronic back pain   . Urinary incontinence   . Osteoporosis   . Gait instability   . Depression   . Anxiety   . Hyperthyroidism   . Hearing loss   . Prolapsing mitral leaflet syndrome    History reviewed. No pertinent past surgical history.  History  Substance Use Topics  . Smoking status: Never Smoker   . Smokeless tobacco: Never Used  . Alcohol Use: No    Review of Systems  Unable to perform ROS: Dementia  Gastrointestinal: Positive for hematochezia.    Allergies  Codeine and Penicillins  Home Medications   Current Outpatient Rx  Name  Route  Sig  Dispense  Refill  . acetaminophen (TYLENOL) 500 MG tablet   Oral   Take 500 mg by mouth every 6 (six) hours as needed for pain.         Marland Kitchen albuterol (PROVENTIL) (2.5 MG/3ML) 0.083% nebulizer solution   Nebulization   Take 2.5 mg by nebulization every 6 (six) hours as needed for wheezing.         Marland Kitchen alendronate (FOSAMAX) 70 MG tablet   Oral   Take 70 mg by mouth every 7 (seven) days. Take with a full glass of water on an empty stomach.         Marland Kitchen alum & mag hydroxide-simeth (MAALOX/MYLANTA) 200-200-20 MG/5ML suspension   Oral   Take 15 mLs by mouth every 6 (six) hours as needed for indigestion.         Marland Kitchen  aspirin EC 81 MG tablet   Oral   Take 81 mg by mouth daily.         . benzonatate (TESSALON) 100 MG capsule   Oral   Take 100 mg by mouth 3 (three) times daily as needed for cough.         . docusate sodium (COLACE) 100 MG capsule   Oral   Take 100 mg by mouth 2 (two) times daily.         Marland Kitchen donepezil (ARICEPT) 10 MG tablet   Oral   Take 10 mg by mouth at bedtime.         Marland Kitchen escitalopram (LEXAPRO) 20 MG tablet   Oral   Take 20 mg by mouth daily.         . furosemide (LASIX) 20 MG tablet   Oral   Take 20 mg by mouth 2 (two) times daily.         Marland Kitchen guaiFENesin (ROBITUSSIN) 100 MG/5ML liquid   Oral   Take 200 mg by mouth 3 (three) times daily as needed for cough.         . levothyroxine (SYNTHROID, LEVOTHROID) 75  MCG tablet   Oral   Take 75 mcg by mouth daily before breakfast.         . loperamide (IMODIUM A-D) 2 MG tablet   Oral   Take 2 mg by mouth 4 (four) times daily as needed for diarrhea or loose stools.         . magnesium hydroxide (MILK OF MAGNESIA) 400 MG/5ML suspension   Oral   Take 30 mLs by mouth daily as needed for constipation.         . Memantine HCl ER (NAMENDA XR) 28 MG CP24   Oral   Take 28 mg by mouth daily.         . metoprolol tartrate (LOPRESSOR) 25 MG tablet   Oral   Take 25 mg by mouth 2 (two) times daily.         . Vitamin D, Ergocalciferol, (DRISDOL) 50000 UNITS CAPS capsule   Oral   Take 50,000 Units by mouth every 7 (seven) days.          BP 135/82  Pulse 67  Temp(Src) 97.8 F (36.6 C) (Oral)  Resp 16  SpO2 94% Physical Exam 1525: Physical examination:  Nursing notes reviewed; Vital signs and O2 SAT reviewed;  Constitutional: Well developed, Well nourished, Well hydrated, In no acute distress; Head:  Normocephalic, atraumatic; Eyes: EOMI, PERRL, No scleral icterus; ENMT: Mouth and pharynx normal, Mucous membranes moist; Neck: Supple, Full range of motion, No lymphadenopathy; Cardiovascular: Regular rate and  rhythm, No gallop; Respiratory: Breath sounds clear & equal bilaterally, No rales, rhonchi, wheezes.  Speaking full sentences with ease, Normal respiratory effort/excursion; Chest: Nontender, Movement normal; Abdomen: Soft, +mild LLQ tenderness to palp. No rebound or guarding. Nondistended, Normal bowel sounds. Rectal exam performed w/permission of pt and ED RN chaperone present.  Anal tone normal.  Non-tender, soft brown stool in rectal vault, heme neg.  No fissures, no external hemorrhoids, no palp masses.;;; Genitourinary: No CVA tenderness; Extremities: Pulses normal, No tenderness, No edema, No calf edema or asymmetry.; Neuro: Awake, alert, confused re: time, place, events per hx dementia. Major CN grossly intact.  Speech clear. No gross focal motor or sensory deficits in extremities.; Skin: Color normal, Warm, Dry.   ED Course  Procedures     MDM  MDM Reviewed: nursing note, vitals and previous chart Reviewed previous: labs Interpretation: labs, CT scan and x-ray     Results for orders placed during the hospital encounter of 11/10/12  CBC      Result Value Range   WBC 8.7  4.0 - 10.5 K/uL   RBC 4.65  3.87 - 5.11 MIL/uL   Hemoglobin 13.6  12.0 - 15.0 g/dL   HCT 62.1  30.8 - 65.7 %   MCV 90.3  78.0 - 100.0 fL   MCH 29.2  26.0 - 34.0 pg   MCHC 32.4  30.0 - 36.0 g/dL   RDW 84.6  96.2 - 95.2 %   Platelets 257  150 - 400 K/uL  COMPREHENSIVE METABOLIC PANEL      Result Value Range   Sodium 140  135 - 145 mEq/L   Potassium 3.4 (*) 3.5 - 5.1 mEq/L   Chloride 104  96 - 112 mEq/L   CO2 24  19 - 32 mEq/L   Glucose, Bld 150 (*) 70 - 99 mg/dL   BUN 10  6 - 23 mg/dL   Creatinine, Ser 8.41 (*) 0.50 - 1.10 mg/dL   Calcium 9.4  8.4 - 32.4 mg/dL  Total Protein 7.0  6.0 - 8.3 g/dL   Albumin 3.0 (*) 3.5 - 5.2 g/dL   AST 12  0 - 37 U/L   ALT 10  0 - 35 U/L   Alkaline Phosphatase 76  39 - 117 U/L   Total Bilirubin 0.2 (*) 0.3 - 1.2 mg/dL   GFR calc non Af Amer 87 (*) >90 mL/min   GFR  calc Af Amer >90  >90 mL/min  LIPASE, BLOOD      Result Value Range   Lipase 72 (*) 11 - 59 U/L  URINALYSIS W MICROSCOPIC + REFLEX CULTURE      Result Value Range   Color, Urine YELLOW  YELLOW   APPearance CLOUDY (*) CLEAR   Specific Gravity, Urine 1.025  1.005 - 1.030   pH 5.0  5.0 - 8.0   Glucose, UA NEGATIVE  NEGATIVE mg/dL   Hgb urine dipstick TRACE (*) NEGATIVE   Bilirubin Urine NEGATIVE  NEGATIVE   Ketones, ur NEGATIVE  NEGATIVE mg/dL   Protein, ur NEGATIVE  NEGATIVE mg/dL   Urobilinogen, UA 1.0  0.0 - 1.0 mg/dL   Nitrite POSITIVE (*) NEGATIVE   Leukocytes, UA SMALL (*) NEGATIVE   WBC, UA 11-20  <3 WBC/hpf   RBC / HPF 0-2  <3 RBC/hpf   Bacteria, UA MANY (*) RARE   Squamous Epithelial / LPF RARE  RARE   Urine-Other AMORPHOUS URATES/PHOSPHATES    OCCULT BLOOD, POC DEVICE      Result Value Range   Fecal Occult Bld NEGATIVE  NEGATIVE  TYPE AND SCREEN      Result Value Range   ABO/RH(D) A NEG     Antibody Screen NEG     Sample Expiration 11/13/2012    ABO/RH      Result Value Range   ABO/RH(D) A NEG     Dg Chest 2 View 11/10/2012   CLINICAL DATA:  Rectal bleeding, abdominal pain.  EXAM: CHEST  2 VIEW  COMPARISON:  None.  FINDINGS: Cardiomegaly with vascular congestion. No overt edema. No confluent opacities or effusions. No acute bony abnormality.  IMPRESSION: Cardiomegaly, vascular congestion.   Electronically Signed   By: Charlett Nose M.D.   On: 11/10/2012 16:53   Ct Abdomen Pelvis W Contrast 11/10/2012   *RADIOLOGY REPORT*  Clinical Data: Rectal bleeding and abdominal pain  CT ABDOMEN AND PELVIS WITH CONTRAST  Technique:  Multidetector CT imaging of the abdomen and pelvis was performed following the standard protocol during bolus administration of intravenous contrast.  Contrast: OMNIPAQUE IOHEXOL 300 MG/ML  SOLN, 50mL OMNIPAQUE IOHEXOL 300 MG/ML  SOLN  Comparison: None.  Findings: There is atelectasis noted within the lung bases.  No pleural or pericardial effusion  identified.  There is a fluid attenuating structure within the right hepatic lobe measuring 5.2 cm, image 23/series 2.  Within the lateral segment of the left hepatic lobe there is a smaller fluid attenuating structure measuring 8 mm, image 23/series 2. Multiple stones are identified within the gallbladder lumen.  These measure up to 1.4 cm, image 37/series 2.  No biliary dilatation. The pancreas appears within normal limits.  Normal appearance of the spleen.  The adrenal glands are both within normal limits. Low lying and malrotated right kidney is noted. Multiple mid pole and inferior pole left kidney stones identified.  The largest measures 6 mm.  No hydronephrosis or hydroureter identified.  Normal appearance of the urinary bladder.  Calcified atherosclerotic disease affects the abdominal aorta.  No aneurysm.  There is no upper abdominal adenopathy noted.  No pelvic or inguinal adenopathy identified.  The stomach appears within normal limits.  The small bowel loops are normal in course and caliber.  The proximal colon appears normal.  Distal colonic diverticula are identified without acute inflammation.  Review of the visualized osseous structures is significant for multi level degenerative disc disease throughout the lumbar spine. No aggressive lytic or sclerotic bone lesions identified. Indeterminate nodule in the right breast measures 1.4 cm, image 3/series 2.  IMPRESSION:  1.  No acute findings identified within the abdomen or pelvis. 2.  Gallstones. 3.  Liver cysts.  4.  Left renal calculi. 5.  Sigmoid diverticular disease without acute inflammation. 6.  Indeterminant nodule in the right breast.  Recommend further evaluation with dedicated breast imaging.   Original Report Authenticated By: Signa Kell, M.D.     1900:  H/H and VSS. +UTI, UC pending; will tx with cipro. No acute findings on CT to account for rectal bleeding. Stool heme negative. Has not stooled while in the ED. Has tol PO well without  N/V. Abd benign. Family would like her to go back to the NH now. Dx and testing d/w pt and family.  Questions answered.  Verb understanding, agreeable to d/c back to NH with outpt f/u.    Laray Anger, DO 11/14/12 [phone]

## 2012-11-10 NOTE — Progress Notes (Addendum)
CSW met at bedside with pt.  Pt confirmed that she is a resident of [address] and that she plans to return once she is medically stable.  Pts family was at her bedside.  Her son, Mardene Sayer states that he shares HCPOA with his sister, Malva Cogan.  Mr. Tyler Aas contact numbers are [phone] and [phone] (h).  Family thanked CSW for concern.  Marva Panda, LCSWA  [phone] .11/10/2012 7:00 pm

## 2012-11-10 NOTE — ED Notes (Signed)
Per EMS pt from [facility], pt started having rectal bleeding this morning, bright red blood, BP 118/64, HR 78. Pt complaining of back pain which is chronic.

## 2012-11-11 ENCOUNTER — Encounter (HOSPITAL_COMMUNITY): Payer: Self-pay | Admitting: Radiology

## 2012-11-13 LAB — URINE CULTURE

## 2012-11-14 NOTE — ED Notes (Signed)
+   Urine Chart appended per protocol MD. 

## 2013-03-02 ENCOUNTER — Inpatient Hospital Stay (HOSPITAL_COMMUNITY)
Admission: EM | Admit: 2013-03-02 | Discharge: 2013-03-06 | DRG: 689 | Disposition: A | Discharge status: Home Health | Payer: Medicare Other | Attending: Internal Medicine | Admitting: Internal Medicine

## 2013-03-02 ENCOUNTER — Emergency Department (HOSPITAL_COMMUNITY): Payer: Medicare Other

## 2013-03-02 ENCOUNTER — Encounter (HOSPITAL_COMMUNITY): Payer: Self-pay | Admitting: Emergency Medicine

## 2013-03-02 DIAGNOSIS — R627 Adult failure to thrive: Secondary | ICD-10-CM | POA: Diagnosis present

## 2013-03-02 DIAGNOSIS — F329 Major depressive disorder, single episode, unspecified: Secondary | ICD-10-CM | POA: Diagnosis present

## 2013-03-02 DIAGNOSIS — H9190 Unspecified hearing loss, unspecified ear: Secondary | ICD-10-CM | POA: Diagnosis present

## 2013-03-02 DIAGNOSIS — F411 Generalized anxiety disorder: Secondary | ICD-10-CM | POA: Diagnosis present

## 2013-03-02 DIAGNOSIS — Z79899 Other long term (current) drug therapy: Secondary | ICD-10-CM

## 2013-03-02 DIAGNOSIS — A498 Other bacterial infections of unspecified site: Secondary | ICD-10-CM | POA: Diagnosis present

## 2013-03-02 DIAGNOSIS — E876 Hypokalemia: Secondary | ICD-10-CM

## 2013-03-02 DIAGNOSIS — F0280 Dementia in other diseases classified elsewhere without behavioral disturbance: Secondary | ICD-10-CM | POA: Diagnosis present

## 2013-03-02 DIAGNOSIS — E0590 Thyrotoxicosis, unspecified without thyrotoxic crisis or storm: Secondary | ICD-10-CM

## 2013-03-02 DIAGNOSIS — Z87891 Personal history of nicotine dependence: Secondary | ICD-10-CM

## 2013-03-02 DIAGNOSIS — S22009A Unspecified fracture of unspecified thoracic vertebra, initial encounter for closed fracture: Secondary | ICD-10-CM | POA: Diagnosis present

## 2013-03-02 DIAGNOSIS — S22019A Unspecified fracture of first thoracic vertebra, initial encounter for closed fracture: Secondary | ICD-10-CM

## 2013-03-02 DIAGNOSIS — F32A Depression, unspecified: Secondary | ICD-10-CM

## 2013-03-02 DIAGNOSIS — I517 Cardiomegaly: Secondary | ICD-10-CM

## 2013-03-02 DIAGNOSIS — F3289 Other specified depressive episodes: Secondary | ICD-10-CM | POA: Diagnosis present

## 2013-03-02 DIAGNOSIS — F0390 Unspecified dementia without behavioral disturbance: Secondary | ICD-10-CM

## 2013-03-02 DIAGNOSIS — G9340 Encephalopathy, unspecified: Secondary | ICD-10-CM | POA: Diagnosis present

## 2013-03-02 DIAGNOSIS — J45909 Unspecified asthma, uncomplicated: Secondary | ICD-10-CM | POA: Diagnosis present

## 2013-03-02 DIAGNOSIS — Z880 Allergy status to penicillin: Secondary | ICD-10-CM

## 2013-03-02 DIAGNOSIS — W19XXXA Unspecified fall, initial encounter: Secondary | ICD-10-CM | POA: Diagnosis present

## 2013-03-02 DIAGNOSIS — J189 Pneumonia, unspecified organism: Secondary | ICD-10-CM

## 2013-03-02 DIAGNOSIS — R32 Unspecified urinary incontinence: Secondary | ICD-10-CM | POA: Diagnosis present

## 2013-03-02 DIAGNOSIS — G309 Alzheimer's disease, unspecified: Secondary | ICD-10-CM | POA: Diagnosis present

## 2013-03-02 DIAGNOSIS — Z7982 Long term (current) use of aspirin: Secondary | ICD-10-CM

## 2013-03-02 DIAGNOSIS — N390 Urinary tract infection, site not specified: Principal | ICD-10-CM

## 2013-03-02 LAB — BASIC METABOLIC PANEL
BUN: 11 mg/dL (ref 6–23)
CHLORIDE: 102 meq/L (ref 96–112)
CO2: 25 mEq/L (ref 19–32)
Calcium: 9.7 mg/dL (ref 8.4–10.5)
Creatinine, Ser: 0.58 mg/dL (ref 0.50–1.10)
GFR calc Af Amer: >90 mL/min (ref >90)
GFR calc non Af Amer: 81 mL/min — ABNORMAL LOW (ref >90)
GLUCOSE: 112 mg/dL — AB (ref 70–99)
POTASSIUM: 3.9 meq/L (ref 3.7–5.3)
Sodium: 139 mEq/L (ref 137–147)

## 2013-03-02 LAB — URINALYSIS, ROUTINE W REFLEX MICROSCOPIC
Bilirubin Urine: NEGATIVE
GLUCOSE, UA: NEGATIVE mg/dL
Ketones, ur: NEGATIVE mg/dL
Nitrite: POSITIVE — AB
PH: 5.5 (ref 5.0–8.0)
Protein, ur: 30 mg/dL — AB
SPECIFIC GRAVITY, URINE: 1.021 (ref 1.005–1.030)
Urobilinogen, UA: 1 mg/dL (ref 0.0–1.0)

## 2013-03-02 LAB — CBC
HEMATOCRIT: 45.2 % (ref 36.0–46.0)
Hemoglobin: 14.5 g/dL (ref 12.0–15.0)
MCH: 28.8 pg (ref 26.0–34.0)
MCHC: 32.1 g/dL (ref 30.0–36.0)
MCV: 89.7 fL (ref 78.0–100.0)
Platelets: 221 10*3/uL (ref 150–400)
RBC: 5.04 MIL/uL (ref 3.87–5.11)
RDW: 14.2 % (ref 11.5–15.5)
WBC: 10.4 10*3/uL (ref 4.0–10.5)

## 2013-03-02 LAB — URINE MICROSCOPIC-ADD ON

## 2013-03-02 MED ORDER — DEXTROSE 5 % IV SOLN
1.0000 g | Freq: Once | INTRAVENOUS | Status: AC
  Administered 2013-03-02: 1 g via INTRAVENOUS
  Filled 2013-03-02: qty 10

## 2013-03-02 NOTE — Progress Notes (Signed)
Patient is oriented to self. She does not recall falling. We performed active range of motion of all extremities. Skin is intact and patient was able to stand at the bedside with assistance. Denies pain and/or discomfort.

## 2013-03-02 NOTE — ED Notes (Signed)
Patient transported to CT 

## 2013-03-02 NOTE — ED Notes (Signed)
Per EMS pt ffrom [facility] with c/o unwitnessed fall, unknown if she hit her head, send for eval. Pt denies any complaints.

## 2013-03-02 NOTE — ED Provider Notes (Signed)
  Face-to-face evaluation   History: Patient fell. Fall was not witnessed. She's not sure why she fell and cannot recall any details about it. She denies pain.  Physical exam: Elderly, female, alert, cooperative. She is very kyphotic. She is able to move her neck without back pain. She has normal grip strength in both hands.  Medical screening examination/treatment/procedure(s) were conducted as a shared visit with non-physician practitioner(s) and myself.  I personally evaluated the patient during the encounter  Flint MelterElliott L Gurneet Matarese, MD 03/04/13 [phone]

## 2013-03-02 NOTE — ED Provider Notes (Signed)
CSN: [id number]     Arrival date & time 03/02/13  1913 History   First MD Initiated Contact with Patient 03/02/13 2011     Chief Complaint  Patient presents with  . Fall   (Consider location/radiation/quality/duration/timing/severity/associated sxs/prior Treatment) HPI Comments: Patient is an 80 year old female who presents today after an unwitnessed fall. She stays at the [facility] house nursing facility. They are unsure if she hit her head, but she complains of no pain. She is oriented to person, but not place or time. She has a history of dementia. Level 5 caveat due to patient's dementia.   The history is provided by the patient.    Past Medical History  Diagnosis Date  . Dementia   . Alzheimer disease   . Osteoporosis   . Hearing loss   . Urinary incontinence   . Asthma   . Alzheimer's dementia   . Chronic back pain   . Urinary incontinence   . Osteoporosis   . Gait instability   . Depression   . Anxiety   . Hyperthyroidism   . Hearing loss   . Prolapsing mitral leaflet syndrome    History reviewed. No pertinent past surgical history. No family history on file. History  Substance Use Topics  . Smoking status: Never Smoker   . Smokeless tobacco: Never Used  . Alcohol Use: No   OB History   Grav Para Term Preterm Abortions TAB SAB Ect Mult Living                 Review of Systems  Unable to perform ROS: Dementia  All other systems reviewed and are negative.    Allergies  Codeine; Codeine; Penicillins; and Penicillins  Home Medications   Current Outpatient Rx  Name  Route  Sig  Dispense  Refill  . acetaminophen (TYLENOL) 500 MG tablet   Oral   Take 500 mg by mouth every 4 (four) hours as needed for mild pain, fever or headache.          . albuterol (PROVENTIL) (2.5 MG/3ML) 0.083% nebulizer solution   Nebulization   Take 2.5 mg by nebulization every 6 (six) hours as needed for wheezing.         Marland Kitchen alendronate (FOSAMAX) 70 MG tablet   Oral   Take  70 mg by mouth every 7 (seven) days. Take with a full glass of water on an empty stomach.         Marland Kitchen alum & mag hydroxide-simeth (MAALOX/MYLANTA) 200-200-20 MG/5ML suspension   Oral   Take 30 mLs by mouth every 6 (six) hours as needed for indigestion or heartburn (NTE 4 doses in 24 hours).          Marland Kitchen aspirin EC 81 MG tablet   Oral   Take 81 mg by mouth daily.         . benzonatate (TESSALON) 100 MG capsule   Oral   Take 100 mg by mouth 3 (three) times daily as needed for cough.         . docusate sodium (COLACE) 100 MG capsule   Oral   Take 100 mg by mouth 2 (two) times daily.         Marland Kitchen donepezil (ARICEPT) 10 MG tablet   Oral   Take 10 mg by mouth at bedtime.         Marland Kitchen escitalopram (LEXAPRO) 20 MG tablet   Oral   Take 20 mg by mouth daily.         Marland Kitchen  furosemide (LASIX) 20 MG tablet   Oral   Take 20 mg by mouth 2 (two) times daily.         Marland Kitchen GLUCOSAMINE CHONDROITIN COMPLX PO   Oral   Take 1 tablet by mouth 3 (three) times daily.         Marland Kitchen guaiFENesin (ROBITUSSIN) 100 MG/5ML liquid   Oral   Take 200 mg by mouth every 6 (six) hours as needed for cough (NTE 4 doses).          Marland Kitchen levothyroxine (SYNTHROID, LEVOTHROID) 75 MCG tablet   Oral   Take 75 mcg by mouth daily before breakfast.         . loperamide (IMODIUM A-D) 2 MG tablet   Oral   Take 2 mg by mouth as needed for diarrhea or loose stools (Take one tablet with each loose stool. NTE 8 doses in 24 hours).          . magnesium hydroxide (MILK OF MAGNESIA) 400 MG/5ML suspension   Oral   Take 30 mLs by mouth at bedtime as needed for mild constipation or moderate constipation.          . Memantine HCl ER (NAMENDA XR) 28 MG CP24   Oral   Take 28 mg by mouth daily.         . metoprolol tartrate (LOPRESSOR) 25 MG tablet   Oral   Take 12.5 mg by mouth 2 (two) times daily.          . Vitamin D, Ergocalciferol, (DRISDOL) 50000 UNITS CAPS capsule   Oral   Take 50,000 Units by mouth every 7  (seven) days.          BP 154/99  Pulse 77  Temp(Src) 98.7 F (37.1 C) (Oral)  Resp 18  SpO2 99% Physical Exam  Nursing note and vitals reviewed. Constitutional: She appears well-developed and well-nourished. No distress.  HENT:  Head: Normocephalic and atraumatic.  Right Ear: External ear normal.  Left Ear: External ear normal.  Nose: Nose normal.  Mouth/Throat: Oropharynx is clear and moist.  Eyes: Conjunctivae, EOM and lids are normal. Pupils are equal, round, and reactive to light.  Neck: Normal range of motion.  Cardiovascular: Normal rate, regular rhythm and normal heart sounds.   Pulmonary/Chest: Effort normal and breath sounds normal. No stridor. No respiratory distress. She has no wheezes. She has no rales.  Abdominal: Soft. She exhibits no distension.  Musculoskeletal: Normal range of motion.  No tenderness over cervical or thoracic spine  Neurological: She is alert. She has normal strength.  Grip strength 5/5 bilaterally  Skin: Skin is warm and dry. She is not diaphoretic. No erythema.  Psychiatric: She has a normal mood and affect. Her behavior is normal.    ED Course  Procedures (including critical care time) Labs Review Labs Reviewed  URINALYSIS, ROUTINE W REFLEX MICROSCOPIC - Abnormal; Notable for the following:    Color, Urine AMBER (*)    APPearance TURBID (*)    Hgb urine dipstick LARGE (*)    Protein, ur 30 (*)    Nitrite POSITIVE (*)    Leukocytes, UA LARGE (*)    All other components within normal limits  BASIC METABOLIC PANEL - Abnormal; Notable for the following:    Glucose, Bld 112 (*)    GFR calc non Af Amer 81 (*)    All other components within normal limits  URINE MICROSCOPIC-ADD ON - Abnormal; Notable for the following:    Bacteria, UA  MANY (*)    All other components within normal limits  URINE CULTURE  CBC   Imaging Review Ct Head Wo Contrast  03/02/2013   CLINICAL DATA:  History of trauma from a fall.  EXAM: CT HEAD WITHOUT  CONTRAST  CT CERVICAL SPINE WITHOUT CONTRAST  TECHNIQUE: Multidetector CT imaging of the head and cervical spine was performed following the standard protocol without intravenous contrast. Multiplanar CT image reconstructions of the cervical spine were also generated.  COMPARISON:  Head CT and cervical spine CT 10/31/2012.  FINDINGS: CT HEAD FINDINGS  Mild to moderate cerebral and cerebellar atrophy. Patchy and confluent areas of decreased attenuation are noted throughout the deep and periventricular white matter of the cerebral hemispheres bilaterally, compatible with chronic microvascular ischemic disease. No acute intracranial abnormalities. Specifically, no evidence of acute intracranial hemorrhage, no definite findings of acute/subacute cerebral ischemia, no mass, mass effect, hydrocephalus or abnormal intra or extra-axial fluid collections. Visualized paranasal sinuses and mastoids are well pneumatized. No acute displaced skull fractures are identified.  CT CERVICAL SPINE FINDINGS  There is an acute compression fracture of T1 with approximately 25% loss of anterior vertebral body height which is new compared to the prior study 10/31/2012. No other definite acute displaced fractures are noted at this time. 3 mm of anterolisthesis of C4 upon C5. This is unchanged. Reversal of normal cervical lordosis centered at the level of C4-C5, also similar to the prior study. Prevertebral soft tissues are normal. Severe multilevel degenerative disc disease, most pronounced at C5-C6 and C6-C7. Severe multilevel facet arthropathy. Visualized portions of the upper thorax are unremarkable.  IMPRESSION: 1. No evidence of significant acute traumatic injury to the head. 2. However, there does appear to be an acute compression fracture of T1 with approximately 25% loss of anterior vertebral body height. 3. Severe multilevel degenerative disc disease and cervical spondylosis is similar to the prior study. 4. Mild to moderate  cerebral and cerebellar atrophy with chronic microvascular ischemic changes in the cerebral white matter, as above. Critical Value/emergent results were called by telephone at the time of interpretation on 03/02/2013 at 9:11 PM to [facility], [place], who verbally acknowledged these results.   Electronically Signed   By: Trudie Reed M.D.   On: 03/02/2013 21:13   Ct Cervical Spine Wo Contrast  03/02/2013   CLINICAL DATA:  History of trauma from a fall.  EXAM: CT HEAD WITHOUT CONTRAST  CT CERVICAL SPINE WITHOUT CONTRAST  TECHNIQUE: Multidetector CT imaging of the head and cervical spine was performed following the standard protocol without intravenous contrast. Multiplanar CT image reconstructions of the cervical spine were also generated.  COMPARISON:  Head CT and cervical spine CT 10/31/2012.  FINDINGS: CT HEAD FINDINGS  Mild to moderate cerebral and cerebellar atrophy. Patchy and confluent areas of decreased attenuation are noted throughout the deep and periventricular white matter of the cerebral hemispheres bilaterally, compatible with chronic microvascular ischemic disease. No acute intracranial abnormalities. Specifically, no evidence of acute intracranial hemorrhage, no definite findings of acute/subacute cerebral ischemia, no mass, mass effect, hydrocephalus or abnormal intra or extra-axial fluid collections. Visualized paranasal sinuses and mastoids are well pneumatized. No acute displaced skull fractures are identified.  CT CERVICAL SPINE FINDINGS  There is an acute compression fracture of T1 with approximately 25% loss of anterior vertebral body height which is new compared to the prior study 10/31/2012. No other definite acute displaced fractures are noted at this time. 3 mm of anterolisthesis of C4 upon C5. This is unchanged.  Reversal of normal cervical lordosis centered at the level of C4-C5, also similar to the prior study. Prevertebral soft tissues are normal. Severe multilevel degenerative disc  disease, most pronounced at C5-C6 and C6-C7. Severe multilevel facet arthropathy. Visualized portions of the upper thorax are unremarkable.  IMPRESSION: 1. No evidence of significant acute traumatic injury to the head. 2. However, there does appear to be an acute compression fracture of T1 with approximately 25% loss of anterior vertebral body height. 3. Severe multilevel degenerative disc disease and cervical spondylosis is similar to the prior study. 4. Mild to moderate cerebral and cerebellar atrophy with chronic microvascular ischemic changes in the cerebral white matter, as above. Critical Value/emergent results were called by telephone at the time of interpretation on 03/02/2013 at 9:11 PM to [facility], PA, who verbally acknowledged these results.   Electronically Signed   By: Trudie Reedaniel  Entrikin M.D.   On: 03/02/2013 21:13    EKG Interpretation   None       MDM  No diagnosis found.  [facility] after an unwitnessed fall. No complaints of pain. Pt oriented to person. CT cervical spine found a compression fracture of T1 with approximately 25% loss of anterior vertebral body height. No neuro deficit. Pt also with UTI. It is possible this is why pt fell. Pt started on rocephin and will be admitted for further management. Vital signs stable at this time. Admission appreciated. Dr. Effie ShyWentz evaluated this patient and agrees with plan.     Mora BellmanHannah S Joandy Burget, PA-C 03/02/13 2337  Mora BellmanHannah S Teana Lindahl, PA-C 03/02/13 [phone]

## 2013-03-02 NOTE — ED Notes (Signed)
Bed: WA04 Expected date:  Expected time:  Means of arrival:  Comments: EMS 

## 2013-03-03 ENCOUNTER — Encounter (HOSPITAL_COMMUNITY): Payer: Self-pay | Admitting: Internal Medicine

## 2013-03-03 DIAGNOSIS — F0390 Unspecified dementia without behavioral disturbance: Secondary | ICD-10-CM

## 2013-03-03 DIAGNOSIS — S22019A Unspecified fracture of first thoracic vertebra, initial encounter for closed fracture: Secondary | ICD-10-CM | POA: Diagnosis present

## 2013-03-03 DIAGNOSIS — N390 Urinary tract infection, site not specified: Principal | ICD-10-CM

## 2013-03-03 DIAGNOSIS — S22009A Unspecified fracture of unspecified thoracic vertebra, initial encounter for closed fracture: Secondary | ICD-10-CM

## 2013-03-03 DIAGNOSIS — I517 Cardiomegaly: Secondary | ICD-10-CM

## 2013-03-03 LAB — COMPREHENSIVE METABOLIC PANEL
ALBUMIN: 2.7 g/dL — AB (ref 3.5–5.2)
ALK PHOS: 55 U/L (ref 39–117)
ALT: 6 U/L (ref 0–35)
AST: 9 U/L (ref 0–37)
BILIRUBIN TOTAL: 0.4 mg/dL (ref 0.3–1.2)
BUN: 11 mg/dL (ref 6–23)
CHLORIDE: 102 meq/L (ref 96–112)
CO2: 25 mEq/L (ref 19–32)
Calcium: 9 mg/dL (ref 8.4–10.5)
Creatinine, Ser: 0.56 mg/dL (ref 0.50–1.10)
GFR calc Af Amer: >90 mL/min (ref >90)
GFR calc non Af Amer: 82 mL/min — ABNORMAL LOW (ref >90)
Glucose, Bld: 95 mg/dL (ref 70–99)
POTASSIUM: 3.2 meq/L — AB (ref 3.7–5.3)
Sodium: 141 mEq/L (ref 137–147)
Total Protein: 6.5 g/dL (ref 6.0–8.3)

## 2013-03-03 LAB — CBC
HCT: 39 % (ref 36.0–46.0)
Hemoglobin: 12.6 g/dL (ref 12.0–15.0)
MCH: 29 pg (ref 26.0–34.0)
MCHC: 32.3 g/dL (ref 30.0–36.0)
MCV: 89.7 fL (ref 78.0–100.0)
PLATELETS: 211 10*3/uL (ref 150–400)
RBC: 4.35 MIL/uL (ref 3.87–5.11)
RDW: 14.2 % (ref 11.5–15.5)
WBC: 9.1 10*3/uL (ref 4.0–10.5)

## 2013-03-03 MED ORDER — DOCUSATE SODIUM 100 MG PO CAPS
100.0000 mg | ORAL_CAPSULE | Freq: Two times a day (BID) | ORAL | Status: DC
  Administered 2013-03-03 – 2013-03-06 (×8): 100 mg via ORAL
  Filled 2013-03-03 (×9): qty 1

## 2013-03-03 MED ORDER — SODIUM CHLORIDE 0.9 % IV SOLN: 250.0000 mL | INTRAVENOUS | Status: DC | PRN

## 2013-03-03 MED ORDER — CIPROFLOXACIN IN D5W 400 MG/200ML IV SOLN: 400.0000 mg | INTRAVENOUS | Status: DC

## 2013-03-03 MED ORDER — ONDANSETRON HCL 4 MG/2ML IJ SOLN: 4.0000 mg | Freq: Four times a day (QID) | INTRAMUSCULAR | Status: DC | PRN

## 2013-03-03 MED ORDER — HYDROCODONE-ACETAMINOPHEN 5-325 MG PO TABS: 1.0000 | ORAL_TABLET | ORAL | Status: DC | PRN

## 2013-03-03 MED ORDER — DEXTROSE 5 % IV SOLN
1.0000 g | INTRAVENOUS | Status: DC
  Administered 2013-03-03 – 2013-03-05 (×2): 1 g via INTRAVENOUS
  Filled 2013-03-03 (×3): qty 10

## 2013-03-03 MED ORDER — LEVOTHYROXINE SODIUM 75 MCG PO TABS
75.0000 ug | ORAL_TABLET | Freq: Every day | ORAL | Status: DC
  Administered 2013-03-03 – 2013-03-06 (×4): 75 ug via ORAL
  Filled 2013-03-03 (×5): qty 1

## 2013-03-03 MED ORDER — FUROSEMIDE 20 MG PO TABS
20.0000 mg | ORAL_TABLET | Freq: Two times a day (BID) | ORAL | Status: DC
  Administered 2013-03-03 – 2013-03-06 (×7): 20 mg via ORAL
  Filled 2013-03-03 (×9): qty 1

## 2013-03-03 MED ORDER — ASPIRIN EC 81 MG PO TBEC
81.0000 mg | DELAYED_RELEASE_TABLET | Freq: Every day | ORAL | Status: DC
  Administered 2013-03-03 – 2013-03-06 (×4): 81 mg via ORAL
  Filled 2013-03-03 (×4): qty 1

## 2013-03-03 MED ORDER — ALBUTEROL SULFATE (2.5 MG/3ML) 0.083% IN NEBU
2.5000 mg | INHALATION_SOLUTION | Freq: Four times a day (QID) | RESPIRATORY_TRACT | Status: DC
  Administered 2013-03-03: 2.5 mg via RESPIRATORY_TRACT
  Filled 2013-03-03 (×4): qty 3

## 2013-03-03 MED ORDER — ONDANSETRON HCL 4 MG PO TABS: 4.0000 mg | ORAL_TABLET | Freq: Four times a day (QID) | ORAL | Status: DC | PRN

## 2013-03-03 MED ORDER — ALBUTEROL SULFATE (2.5 MG/3ML) 0.083% IN NEBU: 2.5000 mg | INHALATION_SOLUTION | Freq: Four times a day (QID) | RESPIRATORY_TRACT | Status: DC | PRN

## 2013-03-03 MED ORDER — SODIUM CHLORIDE 0.9 % IJ SOLN: 3.0000 mL | INTRAMUSCULAR | Status: DC | PRN

## 2013-03-03 MED ORDER — ACETAMINOPHEN 325 MG PO TABS: 650.0000 mg | ORAL_TABLET | Freq: Four times a day (QID) | ORAL | Status: DC | PRN

## 2013-03-03 MED ORDER — ACETAMINOPHEN 650 MG RE SUPP: 650.0000 mg | Freq: Four times a day (QID) | RECTAL | Status: DC | PRN

## 2013-03-03 MED ORDER — ENOXAPARIN SODIUM 40 MG/0.4ML ~~LOC~~ SOLN
40.0000 mg | SUBCUTANEOUS | Status: DC
  Administered 2013-03-03 – 2013-03-06 (×3): 40 mg via SUBCUTANEOUS
  Filled 2013-03-03 (×4): qty 0.4

## 2013-03-03 MED ORDER — METOPROLOL TARTRATE 12.5 MG HALF TABLET
12.5000 mg | ORAL_TABLET | Freq: Two times a day (BID) | ORAL | Status: DC
  Administered 2013-03-03 – 2013-03-06 (×8): 12.5 mg via ORAL
  Filled 2013-03-03 (×9): qty 1

## 2013-03-03 MED ORDER — POTASSIUM CHLORIDE CRYS ER 20 MEQ PO TBCR
40.0000 meq | EXTENDED_RELEASE_TABLET | Freq: Once | ORAL | Status: AC
  Administered 2013-03-03: 40 meq via ORAL
  Filled 2013-03-03: qty 2

## 2013-03-03 MED ORDER — MORPHINE SULFATE 2 MG/ML IJ SOLN: 2.0000 mg | INTRAMUSCULAR | Status: DC | PRN

## 2013-03-03 MED ORDER — GUAIFENESIN ER 600 MG PO TB12
600.0000 mg | ORAL_TABLET | Freq: Two times a day (BID) | ORAL | Status: DC
  Administered 2013-03-03 – 2013-03-06 (×7): 600 mg via ORAL
  Filled 2013-03-03 (×10): qty 1

## 2013-03-03 MED ORDER — ESCITALOPRAM OXALATE 20 MG PO TABS
20.0000 mg | ORAL_TABLET | Freq: Every day | ORAL | Status: DC
  Administered 2013-03-03 – 2013-03-06 (×4): 20 mg via ORAL
  Filled 2013-03-03 (×4): qty 1

## 2013-03-03 MED ORDER — DONEPEZIL HCL 10 MG PO TABS
10.0000 mg | ORAL_TABLET | Freq: Every day | ORAL | Status: DC
  Administered 2013-03-03 – 2013-03-05 (×4): 10 mg via ORAL
  Filled 2013-03-03 (×5): qty 1

## 2013-03-03 MED ORDER — DOCUSATE SODIUM 100 MG PO CAPS
100.0000 mg | ORAL_CAPSULE | Freq: Two times a day (BID) | ORAL | Status: DC
  Filled 2013-03-03: qty 1

## 2013-03-03 MED ORDER — SODIUM CHLORIDE 0.9 % IJ SOLN
3.0000 mL | Freq: Two times a day (BID) | INTRAMUSCULAR | Status: DC
  Administered 2013-03-03 – 2013-03-06 (×7): 3 mL via INTRAVENOUS

## 2013-03-03 MED ORDER — MEMANTINE HCL ER 28 MG PO CP24
28.0000 mg | ORAL_CAPSULE | Freq: Every day | ORAL | Status: DC
  Administered 2013-03-03 – 2013-03-06 (×4): 28 mg via ORAL
  Filled 2013-03-03 (×4): qty 28

## 2013-03-03 MED ORDER — ALUM & MAG HYDROXIDE-SIMETH 200-200-20 MG/5ML PO SUSP: 30.0000 mL | Freq: Four times a day (QID) | ORAL | Status: DC | PRN

## 2013-03-03 NOTE — Progress Notes (Addendum)
[facility] PROGRESS NOTE  Jillian Wilkerson ZOX:[id number] DOB: [birth year] DOA: 03/02/2013 PCP: Eartha Inch, MD  Brief narrative: 80 y.o. Female with Alzheimer's dementia, hearing loss, asthma presented to [facility] ED from ALF with main concern of progressively worsening lethargy and AMS. She was unable to provide history on admission due to confusion and dementia. In ED, CT scan of the head and neck showed T1 body fracture with 25% compression which appears to be acute. UA worrisome for UTI. [facility] asked to admit for further evaluation.  Assessment/Plan:  Active Problems:   Acute encephalopathy - most likely multifactorial and secondary to progressive dementia, failure to thrive, UTI - pt is clinically stable but pleasantly confused  - PT evaluation prior to d/c   UTI - continue Rocephin day #2 - follow up on urine culture   Dementia - plan to return to ALF upon discharge    Hypokalemia - supplement and repeat BMP in AM   T1 vertebral fracture - IR consult for further recommendations    Code Status: Full Family Communication: Daughter over the phone  Disposition Plan: ALF when medically stable   Manson Passey, MD  [facility] Pager [phone]  If 7PM-7AM, please contact night-coverage [email or web address] Password [email or web address] 03/03/2013, 9:59 AM   LOS: 1 day   Consultants:  None  Procedures:  None  Antibiotics:  Rocephin 03/02/2013 -->   HPI/Subjective: No events overnight.   Objective: Filed Vitals:   03/02/13 1926 03/03/13 0041 03/03/13 0110 03/03/13 0638  BP: 154/99 145/80 149/91 113/79  Pulse: 77  62 50  Temp: 98.7 F (37.1 C) 98.2 F (36.8 C) 97.5 F (36.4 C) 97.4 F (36.3 C)  TempSrc: Oral Oral Oral Oral  Resp: 18 16 18 18   SpO2: 99% 93% 95% 99%   No intake or output data in the 24 hours ending 03/03/13 0959  Exam:   General:  Pt is alert, awake, NAD  Cardiovascular: Regular rhythm, bradycardic, S1/S2, no murmurs, no rubs, no gallops  Respiratory:  Clear to auscultation bilaterally, no wheezing, no crackles, no rhonchi  Abdomen: Soft, non tender, non distended, bowel sounds present, no guarding  Extremities: LE pitting edema +1 with superficial skin changes, pulses DP and PT palpable bilaterally  Data Reviewed: Basic Metabolic Panel:  Recent Labs Lab 03/02/13 2140 03/03/13 0520  NA 139 141  K 3.9 3.2*  CL 102 102  CO2 25 25  GLUCOSE 112* 95  BUN 11 11  CREATININE 0.58 0.56  CALCIUM 9.7 9.0   Liver Function Tests:  Recent Labs Lab 03/03/13 0520  AST 9  ALT 6  ALKPHOS 55  BILITOT 0.4  PROT 6.5  ALBUMIN 2.7*   CBC:  Recent Labs Lab 03/02/13 2140 03/03/13 0520  WBC 10.4 9.1  HGB 14.5 12.6  HCT 45.2 39.0  MCV 89.7 89.7  PLT 221 211   Studies: Ct Cervical Spine Wo Contrast   03/02/2013   1. No evidence of significant acute traumatic injury to the head.  2. However, there does appear to be an acute compression fracture of T1 with approximately 25% loss of anterior vertebral body height.  3. Severe multilevel degenerative disc disease and cervical spondylosis is similar to the prior study.  4. Mild to moderate cerebral and cerebellar atrophy with chronic microvascular ischemic changes in the cerebral white matter, as above.  Scheduled Meds: . aspirin EC  81 mg Oral Daily  . cefTRIAXone  1 g Intravenous Q24H  . docusate sodium  100 mg Oral BID  .  donepezil  10 mg Oral QHS  . enoxaparin  injection  40 mg Subcutaneous Q24H  . escitalopram  20 mg Oral Daily  . furosemide  20 mg Oral BID  . guaiFENesin  600 mg Oral BID  . levothyroxine  75 mcg Oral QAC breakfast  . Memantine HCl ER  28 mg Oral Daily  . metoprolol tartrate  12.5 mg Oral BID   Continuous Infusions:

## 2013-03-03 NOTE — Progress Notes (Signed)
Clinical Social Work Department BRIEF PSYCHOSOCIAL ASSESSMENT 03/03/2013  Patient:  Jillian Wilkerson,Jillian Wilkerson     Account Number:  [id number]     Admit date:  03/02/2013  Clinical Social Worker:  Dennison BullaGERBER,Mineola Duan, LCSW  Date/Time:  03/03/2013 10:30 AM  Referred by:  Physician  Date Referred:  03/03/2013 Referred for  ALF Placement   Other Referral:   Interview type:  Family Other interview type:    PSYCHOSOCIAL DATA Living Status:  FACILITY Admitted from facility:   Level of care:  Assisted Living Primary support name:  Jillian Wilkerson Primary support relationship to patient:  CHILD, ADULT Degree of support available:   Strong    CURRENT CONCERNS Current Concerns  Post-Acute Placement   Other Concerns:    SOCIAL WORK ASSESSMENT / PLAN CSW received referral due to patient being admitted from ALF. CSW reviewed chart and attempted to meet with patient but patient is confused and unable to participate in assessment. Caregiver in room reports that patient's dtr, Jillian Wilkerson, is decision maker for patient.    CSW called and spoke with dtr via phone. CSW introduced myself and explained role. Dtr reports that [facility] years but has been in facilities for the past 3 years due to dementia. Dtr feels that patient is receiving adequate care at ALF and [facility] then she would prefer that patient be able to return to ALF. CSW requested that MD order PT/OT in order to determine if patient is at baseline for ambulations. Dtr reports that due to patient's dementia, she feels that moving patient to SNF would disrupt her and make her uncomfortable.    CSW spoke with [place] at ALF who reports that facility provides care for patient including showering, dressing, going to the bathroom and eating. ALF reports that they can accept patient back once they know patient's ambulating status.    CSW completed FL2 and placed on chart. CSW will continue to follow.    Assessment/plan status:  Psychosocial Support/Ongoing Assessment of Needs Other assessment/ plan:   Information/referral to community resources:   Will return to ALF?    PATIENT'S/FAMILY'S RESPONSE TO PLAN OF CARE: Patient unable to participate in assessment. Dtr engaged and worried about patient's wellbeing. Dtr has considered SNF but does not feel this is the best option considering patient is in memory care unit at ALF. Dtr prefers patient to return to ALF if all at possible. Dtr agreeable for CSW to continue to follow to assist with DC planning.       Jillian Wilkerson, [place] [phone]

## 2013-03-03 NOTE — H&P (Signed)
PCP:  Eartha Inch, MD    Chief Complaint:  Came in after a fall  HPI: Jillian Wilkerson is a 80 y.o. female   has a past medical history of Dementia; Alzheimer disease; Osteoporosis; Hearing loss; Urinary incontinence; Asthma; Alzheimer's dementia; Chronic back pain; Urinary incontinence; Osteoporosis; Gait instability; Depression; Anxiety; Hyperthyroidism; Hearing loss; and Prolapsing mitral leaflet syndrome.   Presented with  Patient presented from a nursing home. She is unable to provide any history patient is pleasantly confused. Per ER notes she had suffered a fall earlier today. She was brought in to emergency department and had a CT scan of the head and neck done that showed T1 body fracture with 25% compression which appears to be acute. Patient did not endorse any pain.  Further workup showed UTI. At which point hospitalist was called on for admission.  Review of Systems:  Unable to obtain  Past Medical History: Past Medical History  Diagnosis Date  . Dementia   . Alzheimer disease   . Osteoporosis   . Hearing loss   . Urinary incontinence   . Asthma   . Alzheimer's dementia   . Chronic back pain   . Urinary incontinence   . Osteoporosis   . Gait instability   . Depression   . Anxiety   . Hyperthyroidism   . Hearing loss   . Prolapsing mitral leaflet syndrome    History reviewed. No pertinent past surgical history.   Medications: Prior to Admission medications   Medication Sig Start Date End Date Taking? Authorizing Provider  acetaminophen (TYLENOL) 500 MG tablet Take 500 mg by mouth every 4 (four) hours as needed for mild pain, fever or headache.    Yes Historical Provider, MD  albuterol (PROVENTIL) (2.5 MG/3ML) 0.083% nebulizer solution Take 2.5 mg by nebulization every 6 (six) hours as needed for wheezing.   Yes Historical Provider, MD  alendronate (FOSAMAX) 70 MG tablet Take 70 mg by mouth every 7 (seven) days. Take with a full glass of water on an empty  stomach.   Yes Historical Provider, MD  alum & mag hydroxide-simeth (MAALOX/MYLANTA) 200-200-20 MG/5ML suspension Take 30 mLs by mouth every 6 (six) hours as needed for indigestion or heartburn (NTE 4 doses in 24 hours).    Yes Historical Provider, MD  aspirin EC 81 MG tablet Take 81 mg by mouth daily.   Yes Historical Provider, MD  benzonatate (TESSALON) 100 MG capsule Take 100 mg by mouth 3 (three) times daily as needed for cough.   Yes Historical Provider, MD  docusate sodium (COLACE) 100 MG capsule Take 100 mg by mouth 2 (two) times daily.   Yes Historical Provider, MD  donepezil (ARICEPT) 10 MG tablet Take 10 mg by mouth at bedtime.   Yes Historical Provider, MD  escitalopram (LEXAPRO) 20 MG tablet Take 20 mg by mouth daily.   Yes Historical Provider, MD  furosemide (LASIX) 20 MG tablet Take 20 mg by mouth 2 (two) times daily.   Yes Historical Provider, MD  GLUCOSAMINE CHONDROITIN COMPLX PO Take 1 tablet by mouth 3 (three) times daily.   Yes Historical Provider, MD  guaiFENesin (ROBITUSSIN) 100 MG/5ML liquid Take 200 mg by mouth every 6 (six) hours as needed for cough (NTE 4 doses).    Yes Historical Provider, MD  levothyroxine (SYNTHROID, LEVOTHROID) 75 MCG tablet Take 75 mcg by mouth daily before breakfast.   Yes Historical Provider, MD  loperamide (IMODIUM A-D) 2 MG tablet Take 2 mg by mouth as needed  for diarrhea or loose stools (Take one tablet with each loose stool. NTE 8 doses in 24 hours).    Yes Historical Provider, MD  magnesium hydroxide (MILK OF MAGNESIA) 400 MG/5ML suspension Take 30 mLs by mouth at bedtime as needed for mild constipation or moderate constipation.    Yes Historical Provider, MD  Memantine HCl ER (NAMENDA XR) 28 MG CP24 Take 28 mg by mouth daily.   Yes Historical Provider, MD  metoprolol tartrate (LOPRESSOR) 25 MG tablet Take 12.5 mg by mouth 2 (two) times daily.    Yes Historical Provider, MD  Vitamin D, Ergocalciferol, (DRISDOL) 50000 UNITS CAPS capsule Take 50,000  Units by mouth every 7 (seven) days.   Yes Historical Provider, MD    Allergies:   Allergies  Allergen Reactions  . Codeine Other (See Comments)    Labored breathing  . Codeine   . Penicillins   . Penicillins Rash    Social History:  [facility]   reports that she has quit smoking. She has never used smokeless tobacco. She reports that she does not drink alcohol or use illicit drugs.   Family History: family history includes Hypertension in her father.    Physical Exam: Patient Vitals for the past 24 hrs:  BP Temp Temp src Pulse Resp SpO2  03/02/13 1926 154/99 mmHg 98.7 F (37.1 C) Oral 77 18 99 %    1. General:  in No Acute distress 2. Psychological: Alert and pleasantly confused 3. Head/ENT:    Dry Mucous Membranes                          Head Non traumatic, neck supple                          Poor Dentition 4. SKIN: normal Skin turgor,  Skin clean Dry and intact no rash 5. Heart: Regular rate and rhythm no Murmur, Rub or gallop 6. Lungs:  no wheezes occasional crackles   7. Abdomen: Soft, non-tender, Non distended 8. Lower extremities: no clubbing, cyanosis, or edema 9. Neurologically Grossly intact, moving all 4 extremities equally not cooperative with exam but moving all extremities equally 10. MSK: Normal range of motion  body mass index is unknown because there is no weight on file.   Labs on Admission:   Recent Labs  03/02/13 2140  NA 139  K 3.9  CL 102  CO2 25  GLUCOSE 112*  BUN 11  CREATININE 0.58  CALCIUM 9.7   No results found for this basename: AST, ALT, ALKPHOS, BILITOT, PROT, ALBUMIN,  in the last 72 hours No results found for this basename: LIPASE, AMYLASE,  in the last 72 hours  Recent Labs  03/02/13 2140  WBC 10.4  HGB 14.5  HCT 45.2  MCV 89.7  PLT 221   No results found for this basename: CKTOTAL, CKMB, CKMBINDEX, TROPONINI,  in the last 72 hours No results found for this basename: TSH, T4TOTAL, FREET3, T3FREE,  THYROIDAB,  in the last 72 hours No results found for this basename: VITAMINB12, FOLATE, FERRITIN, TIBC, IRON, RETICCTPCT,  in the last 72 hours Lab Results  Component Value Date   HGBA1C  Value: 5.4 (NOTE) The ADA recommends the following therapeutic goal for glycemic control related to Hgb A1c measurement: Goal of therapy: <6.5 Hgb A1c  Reference: American Diabetes Association: Clinical Practice Recommendations 2010, Diabetes Care, 2010, 33: (Suppl  1). 10/18/2008  The CrCl is unknown because both a height and weight (above a minimum accepted value) are required for this calculation. ABG    Component Value Date/Time   HCO3 24.6* 10/17/2008 2204   TCO2 26 10/17/2008 2204   O2SAT 90.0 10/17/2008 2204     No results found for this basename: DDIMER     UA evidence of UTI   Cultures:    Component Value Date/Time   SDES URINE, RANDOM 11/10/2012 1620   SPECREQUEST NONE 11/10/2012 1620   CULT  Value: ENTEROBACTER CLOACAE Performed at [facility] 11/10/2012 1620   REPTSTATUS 11/13/2012 FINAL 11/10/2012 1620       Radiological Exams on Admission: Ct Head Wo Contrast  03/02/2013   CLINICAL DATA:  History of trauma from a fall.  EXAM: CT HEAD WITHOUT CONTRAST  CT CERVICAL SPINE WITHOUT CONTRAST  TECHNIQUE: Multidetector CT imaging of the head and cervical spine was performed following the standard protocol without intravenous contrast. Multiplanar CT image reconstructions of the cervical spine were also generated.  COMPARISON:  Head CT and cervical spine CT 10/31/2012.  FINDINGS: CT HEAD FINDINGS  Mild to moderate cerebral and cerebellar atrophy. Patchy and confluent areas of decreased attenuation are noted throughout the deep and periventricular white matter of the cerebral hemispheres bilaterally, compatible with chronic microvascular ischemic disease. No acute intracranial abnormalities. Specifically, no evidence of acute intracranial hemorrhage, no definite findings of acute/subacute  cerebral ischemia, no mass, mass effect, hydrocephalus or abnormal intra or extra-axial fluid collections. Visualized paranasal sinuses and mastoids are well pneumatized. No acute displaced skull fractures are identified.  CT CERVICAL SPINE FINDINGS  There is an acute compression fracture of T1 with approximately 25% loss of anterior vertebral body height which is new compared to the prior study 10/31/2012. No other definite acute displaced fractures are noted at this time. 3 mm of anterolisthesis of C4 upon C5. This is unchanged. Reversal of normal cervical lordosis centered at the level of C4-C5, also similar to the prior study. Prevertebral soft tissues are normal. Severe multilevel degenerative disc disease, most pronounced at C5-C6 and C6-C7. Severe multilevel facet arthropathy. Visualized portions of the upper thorax are unremarkable.  IMPRESSION: 1. No evidence of significant acute traumatic injury to the head. 2. However, there does appear to be an acute compression fracture of T1 with approximately 25% loss of anterior vertebral body height. 3. Severe multilevel degenerative disc disease and cervical spondylosis is similar to the prior study. 4. Mild to moderate cerebral and cerebellar atrophy with chronic microvascular ischemic changes in the cerebral white matter, as above. Critical Value/emergent results were called by telephone at the time of interpretation on 03/02/2013 at 9:11 PM to [facility], [place], who verbally acknowledged these results.   Electronically Signed   By: Trudie Reed M.D.   On: 03/02/2013 21:13   Ct Cervical Spine Wo Contrast  03/02/2013   CLINICAL DATA:  History of trauma from a fall.  EXAM: CT HEAD WITHOUT CONTRAST  CT CERVICAL SPINE WITHOUT CONTRAST  TECHNIQUE: Multidetector CT imaging of the head and cervical spine was performed following the standard protocol without intravenous contrast. Multiplanar CT image reconstructions of the cervical spine were also generated.   COMPARISON:  Head CT and cervical spine CT 10/31/2012.  FINDINGS: CT HEAD FINDINGS  Mild to moderate cerebral and cerebellar atrophy. Patchy and confluent areas of decreased attenuation are noted throughout the deep and periventricular white matter of the cerebral hemispheres bilaterally, compatible with chronic microvascular ischemic disease. No acute  intracranial abnormalities. Specifically, no evidence of acute intracranial hemorrhage, no definite findings of acute/subacute cerebral ischemia, no mass, mass effect, hydrocephalus or abnormal intra or extra-axial fluid collections. Visualized paranasal sinuses and mastoids are well pneumatized. No acute displaced skull fractures are identified.  CT CERVICAL SPINE FINDINGS  There is an acute compression fracture of T1 with approximately 25% loss of anterior vertebral body height which is new compared to the prior study 10/31/2012. No other definite acute displaced fractures are noted at this time. 3 mm of anterolisthesis of C4 upon C5. This is unchanged. Reversal of normal cervical lordosis centered at the level of C4-C5, also similar to the prior study. Prevertebral soft tissues are normal. Severe multilevel degenerative disc disease, most pronounced at C5-C6 and C6-C7. Severe multilevel facet arthropathy. Visualized portions of the upper thorax are unremarkable.  IMPRESSION: 1. No evidence of significant acute traumatic injury to the head. 2. However, there does appear to be an acute compression fracture of T1 with approximately 25% loss of anterior vertebral body height. 3. Severe multilevel degenerative disc disease and cervical spondylosis is similar to the prior study. 4. Mild to moderate cerebral and cerebellar atrophy with chronic microvascular ischemic changes in the cerebral white matter, as above. Critical Value/emergent results were called by telephone at the time of interpretation on 03/02/2013 at 9:11 PM to [facility], [facility], who verbally acknowledged  these results.   Electronically Signed   By: Trudie Reed M.D.   On: 03/02/2013 21:13    Chart has been reviewed  Assessment/Plan  80 year old female with fall T1 compression fracture and UTI  Present on Admission:  . Dementia - continue home medications appears to be pleasantly confused will watch with sundowning  . UTI (lower urinary tract infection) - treat with Rocephin she has rashes or allergies to penicillin ceftriaxone already given in the ER  . T1 vertebral fracture - supportive care at this point she does not appear to be in any pain CT scan did not show any evidence of cord injury   Prophylaxis:  Lovenox  CODE STATUS: Presumed to full code until can be: Confirmed in a.m.  Other plan as per orders.  I have spent a total of 55 min on this admission  Raniyah Curenton 03/03/2013, 12:07 AM

## 2013-03-04 DIAGNOSIS — E0590 Thyrotoxicosis, unspecified without thyrotoxic crisis or storm: Secondary | ICD-10-CM

## 2013-03-04 LAB — URINE CULTURE

## 2013-03-04 LAB — CBC
HEMATOCRIT: 42.1 % (ref 36.0–46.0)
Hemoglobin: 13.6 g/dL (ref 12.0–15.0)
MCH: 29.1 pg (ref 26.0–34.0)
MCHC: 32.3 g/dL (ref 30.0–36.0)
MCV: 90.1 fL (ref 78.0–100.0)
Platelets: 199 10*3/uL (ref 150–400)
RBC: 4.67 MIL/uL (ref 3.87–5.11)
RDW: 14.2 % (ref 11.5–15.5)
WBC: 7.9 10*3/uL (ref 4.0–10.5)

## 2013-03-04 LAB — BASIC METABOLIC PANEL
BUN: 6 mg/dL (ref 6–23)
CO2: 26 mEq/L (ref 19–32)
Calcium: 9.2 mg/dL (ref 8.4–10.5)
Chloride: 105 mEq/L (ref 96–112)
Creatinine, Ser: 0.5 mg/dL (ref 0.50–1.10)
GFR calc Af Amer: >90 mL/min (ref >90)
GFR, EST NON AFRICAN AMERICAN: 85 mL/min — AB (ref >90)
Glucose, Bld: 81 mg/dL (ref 70–99)
POTASSIUM: 4.3 meq/L (ref 3.7–5.3)
SODIUM: 142 meq/L (ref 137–147)

## 2013-03-04 NOTE — ED Provider Notes (Signed)
Medical screening examination/treatment/procedure(s) were performed by non-physician practitioner and as supervising physician I was immediately available for consultation/collaboration.  Flint MelterElliott L Cristina Ceniceros, MD 03/04/13 [phone]

## 2013-03-04 NOTE — Evaluation (Signed)
Physical Therapy Evaluation Patient Details Name: Jillian Wilkerson MRN: [id number] DOB: 05/31/1924 Today's Date: 03/04/2013 Time: [id number] PT Time Calculation (min): 30 min  PT Assessment / Plan / Recommendation History of Present Illness    80 y.o. Female with Alzheimer's dementia, hearing loss, asthma [facility] with main concern of progressively worsening lethargy and AMS. She was unable to provide history on admission due to confusion and dementia. In ED, CT scan of the head and neck showed T1 body fracture with 25% compression which appears to be acute. UA worrisome for UTI   Clinical Impression  Pt very pleasant and cooperative and currently requiring assist with all mobility tasks including initial assist of 2 to stand and ambulate 2* initial retropulsion.  Pt should progress to d/c back to memory unit in ALF with follow up HHPT to maximize IND and saftey    PT Assessment  Patient needs continued PT services    Follow Up Recommendations  Home health PT    Does the patient have the potential to tolerate intense rehabilitation      Barriers to Discharge        Equipment Recommendations  None recommended by PT    Recommendations for Other Services     Frequency Min 3X/week    Precautions / Restrictions Precautions Precautions: Fall Restrictions Weight Bearing Restrictions: No   Pertinent Vitals/Pain No c/o pain      Mobility  Bed Mobility Bed Mobility: Supine to Sit Supine to Sit: HOB elevated;4: Min assist;With rails Details for Bed Mobility Assistance: assisted with pad to bring pt to EOB Transfers Transfers: Sit to Stand;Stand to Sit Sit to Stand: 1: +2 Total assist;From bed;With upper extremity assist;From elevated surface Sit to Stand: Patient Percentage: 60% Stand to Sit: 1: +2 Total assist;To chair/3-in-1 Stand to Sit: Patient Percentage: 80% Details for Transfer Assistance: cues for LE placement and use of UEs to self assist.  Physical  assist to block feet and to bring body wt up and fwd with pt initially retropulsive Ambulation/Gait Ambulation/Gait Assistance: 1: +2 Total assist Ambulation/Gait: Patient Percentage: 70% Ambulation Distance (Feet): 130 Feet Assistive device: Rolling walker Ambulation/Gait Assistance Details: Initially retropulsive but with improving balance with increased distance ambulated.  Pt cued for posture and position from RW Gait Pattern: Step-to pattern;Step-through pattern;Decreased step length - right;Decreased step length - left;Shuffle;Trunk flexed;Wide base of support Gait velocity: decr    Exercises General Exercises - Lower Extremity Ankle Circles/Pumps: AROM;AAROM;Both;15 reps;Supine   PT Diagnosis: Difficulty walking  PT Problem List: Decreased strength;Decreased range of motion;Decreased activity tolerance;Decreased balance;Decreased mobility;Decreased cognition;Decreased knowledge of use of DME;Decreased safety awareness;Obesity PT Treatment Interventions: DME instruction;Gait training;Functional mobility training;Therapeutic activities;Therapeutic exercise;Patient/family education     PT Goals(Current goals can be found in the care plan section) Acute Rehab PT Goals Patient Stated Goal: No goals stated PT Goal Formulation: With patient Time For Goal Achievement: 03/11/13 Potential to Achieve Goals: Fair  Visit Information  Last PT Received On: 03/04/13 Assistance Needed: +2       Prior Functioning  Home Living Family/patient expects to be discharged to:: Assisted living Additional Comments: Memory unit Prior Function Level of Independence: Needs assistance Comments: Pt unable to provide reliable information 2* dementia Communication Communication: No difficulties    Cognition  Cognition Arousal/Alertness: Awake/alert Behavior During Therapy: WFL for tasks assessed/performed Overall Cognitive Status: History of cognitive impairments - at baseline    Extremity/Trunk  Assessment Upper Extremity Assessment Upper Extremity Assessment: RUE deficits/detail;LUE deficits/detail RUE Deficits /  Details: Shoulder elevation to 90  LUE Deficits / Details: Shoulder elevation to 90 Lower Extremity Assessment Lower Extremity Assessment: RLE deficits/detail;LLE deficits/detail RLE Deficits / Details: Strength WFL wtih knee flex ltd to ~75 LLE Deficits / Details: Strength WFL with knee flex ltd to ~75 Cervical / Trunk Assessment Cervical / Trunk Assessment: Kyphotic   Balance Static Sitting Balance Static Sitting - Balance Support: Bilateral upper extremity supported Static Sitting - Level of Assistance: 4: Min assist;5: Stand by assistance (Pt initially retropulsive) Static Sitting - Comment/# of Minutes: 5  End of Session PT - End of Session Equipment Utilized During Treatment: Gait belt Activity Tolerance: Patient tolerated treatment well Patient left: in chair;with call bell/phone within reach;with nursing/sitter in room Nurse Communication: Mobility status  GP     Jillian Wilkerson 03/04/2013, 12:47 PM

## 2013-03-04 NOTE — Progress Notes (Addendum)
[facility] facility cannot accept Pt back over the weekend; they can accept her on Monday.  MD notified.  Providence CrosbyAmanda Reena Borromeo, LCSWA Clinical Social Work [phone]

## 2013-03-04 NOTE — Progress Notes (Signed)
Per MD, Pt not ready for d/c today.  Notified facility.  Providence CrosbyAmanda Romeka Scifres, LCSWA Clinical Social Work [phone]

## 2013-03-04 NOTE — Progress Notes (Addendum)
[facility] PROGRESS NOTE  Jillian Wilkerson ZOX:[id number] DOB: 1924/06/11 DOA: 03/02/2013 PCP: Eartha Inch, MD  Brief narrative: 80 y.o. Female with Alzheimer's dementia, hearing loss, asthma presented to [facility] ED from ALF with main concern of progressively worsening lethargy and AMS. She was unable to provide history on admission due to confusion and dementia. In ED, CT scan of the head and neck showed T1 body fracture with 25% compression which appears to be acute. UA worrisome for UTI. [facility] asked to admit for further evaluation.  Assessment/Plan:  Active Problems:   Acute encephalopathy - most likely multifactorial and secondary to progressive dementia, failure to thrive, UTI - pt is clinically stable but pleasantly confused  - PT evaluation prior to d/c, still awaiting PT eval    UTI - continue Rocephin day #3 - follow up on urine culture; GNR on stain    Dementia - plan to return to ALF upon discharge +/- HHPT if needed   Hypokalemia - daily BMP   T1 vertebral fracture - IR consulted and will monitor pain and order MRI if pain develops will recontact IR. Can f/u outpt if needed as well  - did speak w/ radiology and confirmed that consultation mainly needed for pain control and pt currently pain free. Will continue to monitor and no new consults placed today    Code Status: Full Family Communication: Daughter over the phone  Disposition Plan: ALF when able   Alysia Penna, MD  [facility] Pager [phone]  If 7PM-7AM, please contact night-coverage [email or web address] Password [email or web address] 03/04/2013, 8:51 AM   LOS: 2 days   Consultants:  None  Procedures:  None  Antibiotics:  Rocephin 03/02/2013 -->   HPI/Subjective: No events overnight. No neck pain, only mild dorsal hand pain. No sciatica pain   Objective: Filed Vitals:   03/03/13 1023 03/03/13 1514 03/03/13 2200 03/04/13 0600  BP: 141/72 153/84 171/78 167/92  Pulse: 69 57 59 52  Temp:  97.7 F (36.5 C)  97.7 F (36.5 C) 97.6 F (36.4 C)  TempSrc:  Oral Oral Oral  Resp:  20 16 16   SpO2:  93% 94% 96%    Intake/Output Summary (Last 24 hours) at 03/04/13 0851 Last data filed at 03/04/13 0400  Gross per 24 hour  Intake     50 ml  Output      0 ml  Net     50 ml    Exam:   General:  Pt is alert, awake, NAD  Cardiovascular: Regular rhythm, bradycardic, S1/S2, no murmurs, no rubs, no gallops  Respiratory: Clear to auscultation bilaterally, no wheezing, no crackles, no rhonchi  Abdomen: Soft, non tender, non distended, bowel sounds present, no guarding  Extremities: LE pitting edema +1 with superficial skin changes, pulses DP and PT palpable bilaterally  Data Reviewed: Basic Metabolic Panel:  Recent Labs Lab 03/02/13 2140 03/03/13 0520 03/04/13 0500  NA 139 141 142  K 3.9 3.2* 4.3  CL 102 102 105  CO2 25 25 26   GLUCOSE 112* 95 81  BUN 11 11 6   CREATININE 0.58 0.56 0.50  CALCIUM 9.7 9.0 9.2   Liver Function Tests:  Recent Labs Lab 03/03/13 0520  AST 9  ALT 6  ALKPHOS 55  BILITOT 0.4  PROT 6.5  ALBUMIN 2.7*   CBC:  Recent Labs Lab 03/02/13 2140 03/03/13 0520 03/04/13 0500  WBC 10.4 9.1 7.9  HGB 14.5 12.6 13.6  HCT 45.2 39.0 42.1  MCV 89.7 89.7 90.1  PLT 221 211 199  Studies: Ct Cervical Spine Wo Contrast   03/02/2013   1. No evidence of significant acute traumatic injury to the head.  2. However, there does appear to be an acute compression fracture of T1 with approximately 25% loss of anterior vertebral body height.  3. Severe multilevel degenerative disc disease and cervical spondylosis is similar to the prior study.  4. Mild to moderate cerebral and cerebellar atrophy with chronic microvascular ischemic changes in the cerebral white matter, as above.  Scheduled Meds: . aspirin EC  81 mg Oral Daily  . cefTRIAXone  1 g Intravenous Q24H  . docusate sodium  100 mg Oral BID  . donepezil  10 mg Oral QHS  . enoxaparin  injection  40 mg Subcutaneous  Q24H  . escitalopram  20 mg Oral Daily  . furosemide  20 mg Oral BID  . guaiFENesin  600 mg Oral BID  . levothyroxine  75 mcg Oral QAC breakfast  . Memantine HCl ER  28 mg Oral Daily  . metoprolol tartrate  12.5 mg Oral BID   Continuous Infusions:

## 2013-03-05 LAB — BASIC METABOLIC PANEL
BUN: 8 mg/dL (ref 6–23)
CO2: 27 mEq/L (ref 19–32)
Calcium: 9.5 mg/dL (ref 8.4–10.5)
Chloride: 100 mEq/L (ref 96–112)
Creatinine, Ser: 0.57 mg/dL (ref 0.50–1.10)
GFR, EST NON AFRICAN AMERICAN: 82 mL/min — AB (ref >90)
Glucose, Bld: 100 mg/dL — ABNORMAL HIGH (ref 70–99)
POTASSIUM: 4.1 meq/L (ref 3.7–5.3)
SODIUM: 139 meq/L (ref 137–147)

## 2013-03-05 LAB — CBC
HCT: 43.9 % (ref 36.0–46.0)
HEMOGLOBIN: 14.4 g/dL (ref 12.0–15.0)
MCH: 29.4 pg (ref 26.0–34.0)
MCHC: 32.8 g/dL (ref 30.0–36.0)
MCV: 89.8 fL (ref 78.0–100.0)
Platelets: 201 10*3/uL (ref 150–400)
RBC: 4.89 MIL/uL (ref 3.87–5.11)
RDW: 14.2 % (ref 11.5–15.5)
WBC: 8.2 10*3/uL (ref 4.0–10.5)

## 2013-03-05 MED ORDER — CIPROFLOXACIN HCL 500 MG PO TABS
500.0000 mg | ORAL_TABLET | Freq: Two times a day (BID) | ORAL | Status: DC
  Administered 2013-03-05 – 2013-03-06 (×3): 500 mg via ORAL
  Filled 2013-03-05 (×5): qty 1

## 2013-03-05 NOTE — Progress Notes (Signed)
[facility] PROGRESS NOTE  Jillian BreechMeta Cupp UJW:[id number] DOB: 07/26/1924 DOA: 03/02/2013 PCP: Eartha InchBADGER,MICHAEL C, MD  Brief narrative: 80 y.o. Female with Alzheimer's dementia, hearing loss, asthma [facility] with main concern of progressively worsening lethargy and AMS. She was unable to provide history on admission due to confusion and dementia. In ED, CT scan of the head and neck showed T1 body fracture with 25% compression which appears to be acute. UA worrisome for UTI. [facility] asked to admit for further evaluation.  Assessment/Plan:  Active Problems:   Acute encephalopathy - most likely multifactorial and secondary to progressive dementia, failure to thrive, UTI - pt is clinically stable but pleasantly confused  - PT rec's HHPT, so will d/c back to ALF    UTI - changed Rocephin to cipro based on culture,e coli, day 4   Dementia - plan to return to ALF upon discharge + HHPT (orders placed)    Hypokalemia - daily BMP, pending this AM   T1 vertebral fracture - IR consulted and will monitor pain and order MRI if pain develops will recontact IR. Can f/u outpt if needed as well  - did speak w/ radiology and confirmed that consultation mainly needed for pain control and pt currently pain free. Will continue to monitor and no new consults placed today    Code Status: Full Family Communication: Daughter over the phone  Disposition Plan: ALF w/ PT (orders placed) Angelita Inglesmonday  Hena Ewalt, MD  [facility] Pager [phone]  If 7PM-7AM, please contact night-coverage [email or web address] Password [email or web address] 03/05/2013, 7:28 AM   LOS: 3 days   Consultants:  None  Procedures:  None  Antibiotics:  Rocephin 03/02/2013 --> 1/4  cipro 1/4 -->   HPI/Subjective: No complaints this AM. Eating breakfast. No neck pain   Objective: Filed Vitals:   03/04/13 0600 03/04/13 1500 03/04/13 2204 03/05/13 0618  BP: 167/92 122/80 160/99 167/112  Pulse: 52 71 79 55  Temp: 97.6 F  (36.4 C) 98.8 F (37.1 C) 98.4 F (36.9 C) 97.5 F (36.4 C)  TempSrc: Oral Oral Oral Oral  Resp: 16 16 16 16   SpO2: 96% 94% 94% 95%    Intake/Output Summary (Last 24 hours) at 03/05/13 0728 Last data filed at 03/05/13 0600  Gross per 24 hour  Intake    240 ml  Output      0 ml  Net    240 ml    Exam:   General:  Pt is alert, awake, NAD  Cardiovascular: Regular rhythm, bradycardic, S1/S2, no murmurs, no rubs, no gallops  Respiratory: Clear to auscultation bilaterally, no wheezing, no crackles, no rhonchi  Abdomen: Soft, non tender, non distended, bowel sounds present, no guarding  Extremities: LE pitting edema +1 with superficial skin changes, pulses DP and PT palpable bilaterally  Data Reviewed: Basic Metabolic Panel:  Recent Labs Lab 03/02/13 2140 03/03/13 0520 03/04/13 0500  NA 139 141 142  K 3.9 3.2* 4.3  CL 102 102 105  CO2 25 25 26   GLUCOSE 112* 95 81  BUN 11 11 6   CREATININE 0.58 0.56 0.50  CALCIUM 9.7 9.0 9.2   Liver Function Tests:  Recent Labs Lab 03/03/13 0520  AST 9  ALT 6  ALKPHOS 55  BILITOT 0.4  PROT 6.5  ALBUMIN 2.7*   CBC:  Recent Labs Lab 03/02/13 2140 03/03/13 0520 03/04/13 0500  WBC 10.4 9.1 7.9  HGB 14.5 12.6 13.6  HCT 45.2 39.0 42.1  MCV 89.7 89.7 90.1  PLT  221 211 199   Studies: Ct Cervical Spine Wo Contrast   03/02/2013   1. No evidence of significant acute traumatic injury to the head.  2. However, there does appear to be an acute compression fracture of T1 with approximately 25% loss of anterior vertebral body height.  3. Severe multilevel degenerative disc disease and cervical spondylosis is similar to the prior study.  4. Mild to moderate cerebral and cerebellar atrophy with chronic microvascular ischemic changes in the cerebral white matter, as above.  Scheduled Meds: . aspirin EC  81 mg Oral Daily  . cefTRIAXone  1 g Intravenous Q24H  . docusate sodium  100 mg Oral BID  . donepezil  10 mg Oral QHS  .  enoxaparin  injection  40 mg Subcutaneous Q24H  . escitalopram  20 mg Oral Daily  . furosemide  20 mg Oral BID  . guaiFENesin  600 mg Oral BID  . levothyroxine  75 mcg Oral QAC breakfast  . Memantine HCl ER  28 mg Oral Daily  . metoprolol tartrate  12.5 mg Oral BID   Continuous Infusions:

## 2013-03-06 DIAGNOSIS — F3289 Other specified depressive episodes: Secondary | ICD-10-CM

## 2013-03-06 DIAGNOSIS — F329 Major depressive disorder, single episode, unspecified: Secondary | ICD-10-CM

## 2013-03-06 LAB — BASIC METABOLIC PANEL
BUN: 13 mg/dL (ref 6–23)
CHLORIDE: 100 meq/L (ref 96–112)
CO2: 26 meq/L (ref 19–32)
Calcium: 9.2 mg/dL (ref 8.4–10.5)
Creatinine, Ser: 0.64 mg/dL (ref 0.50–1.10)
GFR calc Af Amer: >90 mL/min (ref >90)
GFR calc non Af Amer: 79 mL/min — ABNORMAL LOW (ref >90)
GLUCOSE: 90 mg/dL (ref 70–99)
POTASSIUM: 4.2 meq/L (ref 3.7–5.3)
SODIUM: 139 meq/L (ref 137–147)

## 2013-03-06 LAB — CBC
HCT: 41.4 % (ref 36.0–46.0)
HEMOGLOBIN: 13.7 g/dL (ref 12.0–15.0)
MCH: 29.8 pg (ref 26.0–34.0)
MCHC: 33.1 g/dL (ref 30.0–36.0)
MCV: 90.2 fL (ref 78.0–100.0)
Platelets: 219 10*3/uL (ref 150–400)
RBC: 4.59 MIL/uL (ref 3.87–5.11)
RDW: 14.3 % (ref 11.5–15.5)
WBC: 7.6 10*3/uL (ref 4.0–10.5)

## 2013-03-06 MED ORDER — HYDROCODONE-ACETAMINOPHEN 5-325 MG PO TABS: 1.0000 | ORAL_TABLET | ORAL | Status: DC | PRN

## 2013-03-06 NOTE — Progress Notes (Signed)
CSW called patient's daughter, Sharl MaMarty to make aware that patient is set to [facility]. RN, Annabelle Harmanana aware & will put d/c summary in packet (in [place]). [facility] called for transport.   Unice BaileyKelly Foley, LCSW [facility] Clinical Social Worker cell #: [phone]

## 2013-03-06 NOTE — Discharge Instructions (Signed)
Urinary Tract Infection  Urinary tract infections (UTIs) can develop anywhere along your urinary tract. Your urinary tract is your body's drainage system for removing wastes and extra water. Your urinary tract includes two kidneys, two ureters, a bladder, and a urethra. Your kidneys are a pair of bean-shaped organs. Each kidney is about the size of your fist. They are located below your ribs, one on each side of your spine.  CAUSES  Infections are caused by microbes, which are microscopic organisms, including fungi, viruses, and bacteria. These organisms are so small that they can only be seen through a microscope. Bacteria are the microbes that most commonly cause UTIs.  SYMPTOMS   Symptoms of UTIs may vary by age and gender of the patient and by the location of the infection. Symptoms in young women typically include a frequent and intense urge to urinate and a painful, burning feeling in the bladder or urethra during urination. Older women and men are more likely to be tired, shaky, and weak and have muscle aches and abdominal pain. A fever may mean the infection is in your kidneys. Other symptoms of a kidney infection include pain in your back or sides below the ribs, nausea, and vomiting.  DIAGNOSIS  To diagnose a UTI, your caregiver will ask you about your symptoms. Your caregiver also will ask to provide a urine sample. The urine sample will be tested for bacteria and white blood cells. White blood cells are made by your body to help fight infection.  TREATMENT   Typically, UTIs can be treated with medication. Because most UTIs are caused by a bacterial infection, they usually can be treated with the use of antibiotics. The choice of antibiotic and length of treatment depend on your symptoms and the type of bacteria causing your infection.  HOME CARE INSTRUCTIONS   If you were prescribed antibiotics, take them exactly as your caregiver instructs you. Finish the medication even if you feel better after you  have only taken some of the medication.   Drink enough water and fluids to keep your urine clear or pale yellow.   Avoid caffeine, tea, and carbonated beverages. They tend to irritate your bladder.   Empty your bladder often. Avoid holding urine for long periods of time.   Empty your bladder before and after sexual intercourse.   After a bowel movement, women should cleanse from front to back. Use each tissue only once.  SEEK MEDICAL CARE IF:    You have back pain.   You develop a fever.   Your symptoms do not begin to resolve within 3 days.  SEEK IMMEDIATE MEDICAL CARE IF:    You have severe back pain or lower abdominal pain.   You develop chills.   You have nausea or vomiting.   You have continued burning or discomfort with urination.  MAKE SURE YOU:    Understand these instructions.   Will watch your condition.   Will get help right away if you are not doing well or get worse.  Document Released: 11/26/2004 Document Revised: 08/18/2011 Document Reviewed: 03/27/2011  ExitCare Patient Information 2014 [employer].

## 2013-03-06 NOTE — Progress Notes (Signed)
Clinical Social Work  CSW faxed AVS and FL[facility] to [facility] and spoke with Windell MouldingRuth who is agreeable to accept patient back. CM set up [facility] as AL[facility] requested. CSW prepared DC packet with FL2 and hard scripts included. CSW informed dtr of DC who requests [facility] transport when ready. CSW will continue to follow to arrange transportation needs.  Delta[place]olly Kalise [place], [place] [phone]

## 2013-03-06 NOTE — Discharge Summary (Addendum)
Physician Discharge Summary  Jillian BreechMeta Wilkerson SAY:[id number]:[id number] DOB: 11/19/1924 DOA: 03/02/2013  PCP: Eartha InchBADGER,MICHAEL C, MD  Admit date: 03/02/2013 Discharge date: 03/06/2013  Recommendations for Outpatient Follow-up:  1. patient has received antibiotics for treatment of urinary tract infection. Please note that antibiotics are not needed at the time of the discharge 2. renal function is within normal limits prior to discharge. All blood work is within normal limits prior to discharge  Discharge Diagnoses:  Active Problems:   Dementia   UTI (lower urinary tract infection)   T1 vertebral fracture    Discharge Condition: Medically stable for discharge home today  Diet recommendation: As tolerated  History of present illness:  80 y.o. Female with Alzheimer's dementia, hearing loss, asthma [facility] with main concern of progressively worsening lethargy and AMS. She was unable to provide history on admission due to confusion and dementia. In ED, CT scan of the head and neck showed T1 body fracture with 25% compression which appears to be acute. UA worrisome for UTI.   Assessment/Plan:   Active Problems:  Acute encephalopathy  - most likely multifactorial and secondary to progressive dementia, failure to thrive, UTI  - pt is clinically stable  - PT rec's HHPT, so will d/c back to ALF  UTI, E.Coli - Changed Rocephin to cipro based on culture - No need for antibiotics on discharge  Dementia  - stable  Hypokalemia  - WNL T1 vertebral fracture  - IR consulted and will monitor pain and order MRI if pain develops will recontact IR. Can f/u outpt if needed as well  - did speak w/ radiology and confirmed that consultation mainly needed for pain control and pt currently pain free.   Code Status: Full  Family Communication: Daughter over the phone    Consultants:  None Procedures:  None Antibiotics:  Rocephin 03/02/2013 --> 1/4  Cipro 1/4 --> 03/06/2013  Signed:  Manson PasseyEVINE,  Zoltan Genest, MD  [facility] 03/06/2013, 1:07 PM  Pager #: [phone]   Discharge Exam: Filed Vitals:   03/06/13 0625  BP: 130/84  Pulse: 57  Temp: 97.7 F (36.5 C)  Resp: 18   Filed Vitals:   03/05/13 0926 03/05/13 1404 03/05/13 2128 03/06/13 0625  BP: 126/77 110/73 141/83 130/84  Pulse: 54 57 68 57  Temp:  98.1 F (36.7 C) 98.4 F (36.9 C) 97.7 F (36.5 C)  TempSrc:  Oral Oral Oral  Resp:  17 18 18   SpO2:  94% 93% 96%    General: Pt is alert, no acute distress Cardiovascular: Regular rate and rhythm, S1/S2 +, no murmurs, no rubs, no gallops Respiratory: Clear to auscultation bilaterally, no wheezing, no crackles, no rhonchi Abdominal: Soft, non tender, non distended, bowel sounds +, no guarding Extremities: no edema, no cyanosis, pulses palpable bilaterally DP and PT Neuro: Grossly nonfocal  Discharge Instructions  Discharge Orders   Future Orders Complete By Expires   Call MD for:  difficulty breathing, headache or visual disturbances  As directed    Call MD for:  persistant dizziness or light-headedness  As directed    Call MD for:  persistant nausea and vomiting  As directed    Call MD for:  severe uncontrolled pain  As directed    Diet - low sodium heart healthy  As directed    Discharge instructions  As directed    Comments:     1. patient has received antibiotics for treatment of urinary tract infection. Please note that antibiotics are not  needed at the time of the discharge 2. renal function is within normal limits prior to discharge. All blood work is within normal limits prior to discharge   Increase activity slowly  As directed        Medication List         acetaminophen 500 MG tablet  Commonly known as:  TYLENOL  Take 500 mg by mouth every 4 (four) hours as needed for mild pain, fever or headache.     albuterol (2.5 MG/3ML) 0.083% nebulizer solution  Commonly known as:  PROVENTIL  Take 2.5 mg by nebulization every 6 (six) hours as needed  for wheezing.     alendronate 70 MG tablet  Commonly known as:  FOSAMAX  Take 70 mg by mouth every 7 (seven) days. Take with a full glass of water on an empty stomach.     alum & mag hydroxide-simeth 200-200-20 MG/5ML suspension  Commonly known as:  MAALOX/MYLANTA  Take 30 mLs by mouth every 6 (six) hours as needed for indigestion or heartburn (NTE 4 doses in 24 hours).     aspirin EC 81 MG tablet  Take 81 mg by mouth daily.     benzonatate 100 MG capsule  Commonly known as:  TESSALON  Take 100 mg by mouth 3 (three) times daily as needed for cough.     docusate sodium 100 MG capsule  Commonly known as:  COLACE  Take 100 mg by mouth 2 (two) times daily.     donepezil 10 MG tablet  Commonly known as:  ARICEPT  Take 10 mg by mouth at bedtime.     escitalopram 20 MG tablet  Commonly known as:  LEXAPRO  Take 20 mg by mouth daily.     furosemide 20 MG tablet  Commonly known as:  LASIX  Take 20 mg by mouth 2 (two) times daily.     GLUCOSAMINE CHONDROITIN COMPLX PO  Take 1 tablet by mouth 3 (three) times daily.     guaiFENesin 100 MG/5ML liquid  Commonly known as:  ROBITUSSIN  Take 200 mg by mouth every 6 (six) hours as needed for cough (NTE 4 doses).     HYDROcodone-acetaminophen 5-325 MG per tablet  Commonly known as:  NORCO/VICODIN  Take 1 tablets by mouth every 4 (four) hours as needed for moderate pain.     levothyroxine 75 MCG tablet  Commonly known as:  SYNTHROID, LEVOTHROID  Take 75 mcg by mouth daily before breakfast.     loperamide 2 MG tablet  Commonly known as:  IMODIUM A-D  Take 2 mg by mouth as needed for diarrhea or loose stools (Take one tablet with each loose stool. NTE 8 doses in 24 hours).     magnesium hydroxide 400 MG/5ML suspension  Commonly known as:  MILK OF MAGNESIA  Take 30 mLs by mouth at bedtime as needed for mild constipation or moderate constipation.     metoprolol tartrate 25 MG tablet  Commonly known as:  LOPRESSOR  Take 12.5 mg by  mouth 2 (two) times daily.     NAMENDA XR 28 MG Cp24  Generic drug:  Memantine HCl ER  Take 28 mg by mouth daily.     Vitamin D (Ergocalciferol) 50000 UNITS Caps capsule  Commonly known as:  DRISDOL  Take 50,000 Units by mouth every 7 (seven) days.           Follow-up Information   Follow up with Eartha Inch, MD. Schedule an appointment as soon as  possible for a visit in 1 week.   Specialty:  Family Medicine   Contact information:   [address] [place] [address] [phone]        The results of significant diagnostics from this hospitalization (including imaging, microbiology, ancillary and laboratory) are listed below for reference.    Significant Diagnostic Studies: Ct Head Wo Contrast  03/02/2013   CLINICAL DATA:  History of trauma from a fall.  EXAM: CT HEAD WITHOUT CONTRAST  CT CERVICAL SPINE WITHOUT CONTRAST  TECHNIQUE: Multidetector CT imaging of the head and cervical spine was performed following the standard protocol without intravenous contrast. Multiplanar CT image reconstructions of the cervical spine were also generated.  COMPARISON:  Head CT and cervical spine CT 10/31/2012.  FINDINGS: CT HEAD FINDINGS  Mild to moderate cerebral and cerebellar atrophy. Patchy and confluent areas of decreased attenuation are noted throughout the deep and periventricular white matter of the cerebral hemispheres bilaterally, compatible with chronic microvascular ischemic disease. No acute intracranial abnormalities. Specifically, no evidence of acute intracranial hemorrhage, no definite findings of acute/subacute cerebral ischemia, no mass, mass effect, hydrocephalus or abnormal intra or extra-axial fluid collections. Visualized paranasal sinuses and mastoids are well pneumatized. No acute displaced skull fractures are identified.  CT CERVICAL SPINE FINDINGS  There is an acute compression fracture of T1 with approximately 25% loss of anterior vertebral body height which is  new compared to the prior study 10/31/2012. No other definite acute displaced fractures are noted at this time. 3 mm of anterolisthesis of C4 upon C5. This is unchanged. Reversal of normal cervical lordosis centered at the level of C4-C5, also similar to the prior study. Prevertebral soft tissues are normal. Severe multilevel degenerative disc disease, most pronounced at C5-C6 and C6-C7. Severe multilevel facet arthropathy. Visualized portions of the upper thorax are unremarkable.  IMPRESSION: 1. No evidence of significant acute traumatic injury to the head. 2. However, there does appear to be an acute compression fracture of T1 with approximately 25% loss of anterior vertebral body height. 3. Severe multilevel degenerative disc disease and cervical spondylosis is similar to the prior study. 4. Mild to moderate cerebral and cerebellar atrophy with chronic microvascular ischemic changes in the cerebral white matter, as above. Critical Value/emergent results were called by telephone at the time of interpretation on 03/02/2013 at 9:11 PM to [facility], who verbally acknowledged these results.   Electronically Signed   By: Trudie Reed M.D.   On: 03/02/2013 21:13   Ct Cervical Spine Wo Contrast  03/02/2013   CLINICAL DATA:  History of trauma from a fall.  EXAM: CT HEAD WITHOUT CONTRAST  CT CERVICAL SPINE WITHOUT CONTRAST  TECHNIQUE: Multidetector CT imaging of the head and cervical spine was performed following the standard protocol without intravenous contrast. Multiplanar CT image reconstructions of the cervical spine were also generated.  COMPARISON:  Head CT and cervical spine CT 10/31/2012.  FINDINGS: CT HEAD FINDINGS  Mild to moderate cerebral and cerebellar atrophy. Patchy and confluent areas of decreased attenuation are noted throughout the deep and periventricular white matter of the cerebral hemispheres bilaterally, compatible with chronic microvascular ischemic disease. No acute intracranial  abnormalities. Specifically, no evidence of acute intracranial hemorrhage, no definite findings of acute/subacute cerebral ischemia, no mass, mass effect, hydrocephalus or abnormal intra or extra-axial fluid collections. Visualized paranasal sinuses and mastoids are well pneumatized. No acute displaced skull fractures are identified.  CT CERVICAL SPINE FINDINGS  There is an acute compression fracture of T1 with  approximately 25% loss of anterior vertebral body height which is new compared to the prior study 10/31/2012. No other definite acute displaced fractures are noted at this time. 3 mm of anterolisthesis of C4 upon C5. This is unchanged. Reversal of normal cervical lordosis centered at the level of C4-C5, also similar to the prior study. Prevertebral soft tissues are normal. Severe multilevel degenerative disc disease, most pronounced at C5-C6 and C6-C7. Severe multilevel facet arthropathy. Visualized portions of the upper thorax are unremarkable.  IMPRESSION: 1. No evidence of significant acute traumatic injury to the head. 2. However, there does appear to be an acute compression fracture of T1 with approximately 25% loss of anterior vertebral body height. 3. Severe multilevel degenerative disc disease and cervical spondylosis is similar to the prior study. 4. Mild to moderate cerebral and cerebellar atrophy with chronic microvascular ischemic changes in the cerebral white matter, as above. Critical Value/emergent results were called by telephone at the time of interpretation on 03/02/2013 at 9:11 PM to [facility], who verbally acknowledged these results.   Electronically Signed   By: Trudie Reed M.D.   On: 03/02/2013 21:13    Microbiology: Recent Results (from the past 240 hour(s))  URINE CULTURE     Status: None   Collection Time    03/02/13  9:31 PM      Result Value Range Status   Specimen Description URINE, CLEAN CATCH   Final   Special Requests NONE   Final   Culture  Setup Time      Final   Value: 03/03/2013 03:10     Performed at [facility] Count     Final   Value: >=100,000 COLONIES/ML     Performed at [facility]   Culture     Final   Value: ESCHERICHIA COLI     Performed at [facility]   Report Status 03/04/2013 FINAL   Final   Organism ID, Bacteria ESCHERICHIA COLI   Final     Labs: Basic Metabolic Panel:  Recent Labs Lab 03/02/13 2140 03/03/13 0520 03/04/13 0500 03/05/13 0856 03/06/13 0515  NA 139 141 142 139 139  K 3.9 3.2* 4.3 4.1 4.2  CL 102 102 105 100 100  CO2 25 25 26 27 26   GLUCOSE 112* 95 81 100* 90  BUN 11 11 6 8 13   CREATININE 0.58 0.56 0.50 0.57 0.64  CALCIUM 9.7 9.0 9.2 9.5 9.2   Liver Function Tests:  Recent Labs Lab 03/03/13 0520  AST 9  ALT 6  ALKPHOS 55  BILITOT 0.4  PROT 6.5  ALBUMIN 2.7*   No results found for this basename: LIPASE, AMYLASE,  in the last 168 hours No results found for this basename: AMMONIA,  in the last 168 hours CBC:  Recent Labs Lab 03/02/13 2140 03/03/13 0520 03/04/13 0500 03/05/13 0856 03/06/13 0515  WBC 10.4 9.1 7.9 8.2 7.6  HGB 14.5 12.6 13.6 14.4 13.7  HCT 45.2 39.0 42.1 43.9 41.4  MCV 89.7 89.7 90.1 89.8 90.2  PLT 221 211 199 201 219   Cardiac Enzymes: No results found for this basename: CKTOTAL, CKMB, CKMBINDEX, TROPONINI,  in the last 168 hours BNP: BNP (last 3 results) No results found for this basename: PROBNP,  in the last 8760 hours CBG: No results found for this basename: GLUCAP,  in the last 168 hours  Time coordinating discharge: Over 30 minutes

## 2013-03-07 MED ORDER — HYDROCODONE-ACETAMINOPHEN 5-325 MG PO TABS: 1.0000 | ORAL_TABLET | ORAL | Status: DC | PRN

## 2013-03-17 ENCOUNTER — Emergency Department (HOSPITAL_COMMUNITY): Payer: Medicare Other

## 2013-03-17 ENCOUNTER — Encounter (HOSPITAL_COMMUNITY): Payer: Self-pay | Admitting: Emergency Medicine

## 2013-03-17 ENCOUNTER — Emergency Department (HOSPITAL_COMMUNITY)
Admission: EM | Admit: 2013-03-17 | Discharge: 2013-03-17 | Disposition: A | Discharge status: Skilled Nursing Facility | Payer: Medicare Other | Attending: Emergency Medicine | Admitting: Emergency Medicine

## 2013-03-17 DIAGNOSIS — Y9210 Unspecified residential institution as the place of occurrence of the external cause: Secondary | ICD-10-CM | POA: Insufficient documentation

## 2013-03-17 DIAGNOSIS — F411 Generalized anxiety disorder: Secondary | ICD-10-CM | POA: Insufficient documentation

## 2013-03-17 DIAGNOSIS — IMO0002 Reserved for concepts with insufficient information to code with codable children: Secondary | ICD-10-CM | POA: Insufficient documentation

## 2013-03-17 DIAGNOSIS — S6990XA Unspecified injury of unspecified wrist, hand and finger(s), initial encounter: Secondary | ICD-10-CM | POA: Insufficient documentation

## 2013-03-17 DIAGNOSIS — Y92129 Unspecified place in nursing home as the place of occurrence of the external cause: Secondary | ICD-10-CM

## 2013-03-17 DIAGNOSIS — Z79899 Other long term (current) drug therapy: Secondary | ICD-10-CM | POA: Insufficient documentation

## 2013-03-17 DIAGNOSIS — F329 Major depressive disorder, single episode, unspecified: Secondary | ICD-10-CM | POA: Insufficient documentation

## 2013-03-17 DIAGNOSIS — G309 Alzheimer's disease, unspecified: Secondary | ICD-10-CM | POA: Insufficient documentation

## 2013-03-17 DIAGNOSIS — Z87891 Personal history of nicotine dependence: Secondary | ICD-10-CM | POA: Insufficient documentation

## 2013-03-17 DIAGNOSIS — F3289 Other specified depressive episodes: Secondary | ICD-10-CM | POA: Insufficient documentation

## 2013-03-17 DIAGNOSIS — R296 Repeated falls: Secondary | ICD-10-CM | POA: Insufficient documentation

## 2013-03-17 DIAGNOSIS — Y9389 Activity, other specified: Secondary | ICD-10-CM | POA: Insufficient documentation

## 2013-03-17 DIAGNOSIS — G8929 Other chronic pain: Secondary | ICD-10-CM | POA: Insufficient documentation

## 2013-03-17 DIAGNOSIS — Z880 Allergy status to penicillin: Secondary | ICD-10-CM | POA: Insufficient documentation

## 2013-03-17 DIAGNOSIS — F0280 Dementia in other diseases classified elsewhere without behavioral disturbance: Secondary | ICD-10-CM | POA: Insufficient documentation

## 2013-03-17 DIAGNOSIS — M810 Age-related osteoporosis without current pathological fracture: Secondary | ICD-10-CM | POA: Insufficient documentation

## 2013-03-17 DIAGNOSIS — J45909 Unspecified asthma, uncomplicated: Secondary | ICD-10-CM | POA: Insufficient documentation

## 2013-03-17 DIAGNOSIS — W19XXXA Unspecified fall, initial encounter: Secondary | ICD-10-CM

## 2013-03-17 DIAGNOSIS — E039 Hypothyroidism, unspecified: Secondary | ICD-10-CM | POA: Insufficient documentation

## 2013-03-17 DIAGNOSIS — Z7982 Long term (current) use of aspirin: Secondary | ICD-10-CM | POA: Insufficient documentation

## 2013-03-17 LAB — CBC WITH DIFFERENTIAL/PLATELET
Basophils Absolute: 0.1 10*3/uL (ref 0.0–0.1)
Basophils Relative: 1 % (ref 0–1)
Eosinophils Absolute: 0.3 10*3/uL (ref 0.0–0.7)
Eosinophils Relative: 4 % (ref 0–5)
HCT: 39.5 % (ref 36.0–46.0)
HEMOGLOBIN: 12.6 g/dL (ref 12.0–15.0)
LYMPHS ABS: 1.3 10*3/uL (ref 0.7–4.0)
Lymphocytes Relative: 15 % (ref 12–46)
MCH: 28.4 pg (ref 26.0–34.0)
MCHC: 31.9 g/dL (ref 30.0–36.0)
MCV: 89.2 fL (ref 78.0–100.0)
Monocytes Absolute: 0.6 10*3/uL (ref 0.1–1.0)
Monocytes Relative: 7 % (ref 3–12)
NEUTROS PCT: 74 % (ref 43–77)
Neutro Abs: 6.4 10*3/uL (ref 1.7–7.7)
PLATELETS: 204 10*3/uL (ref 150–400)
RBC: 4.43 MIL/uL (ref 3.87–5.11)
RDW: 14.3 % (ref 11.5–15.5)
WBC: 8.7 10*3/uL (ref 4.0–10.5)

## 2013-03-17 LAB — BASIC METABOLIC PANEL
BUN: 11 mg/dL (ref 6–23)
CO2: 26 mEq/L (ref 19–32)
Calcium: 9 mg/dL (ref 8.4–10.5)
Chloride: 103 mEq/L (ref 96–112)
Creatinine, Ser: 0.52 mg/dL (ref 0.50–1.10)
GFR calc Af Amer: >90 mL/min (ref >90)
GFR calc non Af Amer: 84 mL/min — ABNORMAL LOW (ref >90)
GLUCOSE: 92 mg/dL (ref 70–99)
POTASSIUM: 4 meq/L (ref 3.7–5.3)
SODIUM: 142 meq/L (ref 137–147)

## 2013-03-17 NOTE — Discharge Instructions (Signed)
[facility] as needed for pain.

## 2013-03-17 NOTE — ED Notes (Signed)
[facility] She fell from a standing position trying to get into a chair. She c/o pain in her back and neck with ROM. Ccollar remains in place on arrival. VSS en route. Pt with hx of dementia

## 2013-03-17 NOTE — ED Notes (Signed)
Pt returned from radiology.

## 2013-03-17 NOTE — ED Notes (Signed)
Bed: WA08 Expected date:  Expected time:  Means of arrival:  Comments: ems- 80 yo fall, no LOC

## 2013-03-17 NOTE — ED Provider Notes (Signed)
CSN: [id number]     Arrival date & time 03/17/13  1242 History   First MD Initiated Contact with Patient 03/17/13 1247     Chief Complaint  Patient presents with  . Fall   (Consider location/radiation/quality/duration/timing/severity/associated sxs/prior Treatment) HPI Level 5- caveat- Dementia  Patient here with significant dementia here from [facility] by EMS after a witnessed fall. She was in a standing position and going to sit in her chair when she fell trying to sit down. She is complaining of neck pain and upper back pain. She also endorses right hand pain. The facility and patient deny her hitting her head or loc  Past Medical History  Diagnosis Date  . Dementia   . Alzheimer disease   . Osteoporosis   . Hearing loss   . Urinary incontinence   . Asthma   . Alzheimer's dementia   . Chronic back pain   . Urinary incontinence   . Osteoporosis   . Gait instability   . Depression   . Anxiety   . Hyperthyroidism   . Hearing loss   . Prolapsing mitral leaflet syndrome    No past surgical history on file. Family History  Problem Relation Age of Onset  . Hypertension Father    History  Substance Use Topics  . Smoking status: Former Games developer  . Smokeless tobacco: Never Used  . Alcohol Use: No   OB History   Grav Para Term Preterm Abortions TAB SAB Ect Mult Living                 Review of Systems Level 5- caveat- Dementia Allergies  Codeine and Penicillins  Home Medications   Current Outpatient Rx  Name  Route  Sig  Dispense  Refill  . acetaminophen (TYLENOL) 500 MG tablet   Oral   Take 500 mg by mouth every 4 (four) hours as needed for mild pain, fever or headache.          . albuterol (PROVENTIL) (2.5 MG/3ML) 0.083% nebulizer solution   Nebulization   Take 2.5 mg by nebulization every 6 (six) hours as needed for wheezing.         Marland Kitchen alendronate (FOSAMAX) 70 MG tablet   Oral   Take 70 mg by mouth every 7 (seven) days. Take with a full glass of  water on an empty stomach.         Marland Kitchen alum & mag hydroxide-simeth (MAALOX/MYLANTA) 200-200-20 MG/5ML suspension   Oral   Take 30 mLs by mouth every 6 (six) hours as needed for indigestion or heartburn (NTE 4 doses in 24 hours).          Marland Kitchen aspirin EC 81 MG tablet   Oral   Take 81 mg by mouth daily.         . benzonatate (TESSALON) 100 MG capsule   Oral   Take 100 mg by mouth 3 (three) times daily as needed for cough.         . docusate sodium (COLACE) 100 MG capsule   Oral   Take 100 mg by mouth 2 (two) times daily.         Marland Kitchen donepezil (ARICEPT) 10 MG tablet   Oral   Take 10 mg by mouth at bedtime.         Marland Kitchen escitalopram (LEXAPRO) 20 MG tablet   Oral   Take 20 mg by mouth daily.         . furosemide (LASIX) 20 MG tablet  Oral   Take 20 mg by mouth 2 (two) times daily.         Marland Kitchen. GLUCOSAMINE CHONDROITIN COMPLX PO   Oral   Take 1 tablet by mouth 3 (three) times daily.         Marland Kitchen. guaiFENesin (ROBITUSSIN) 100 MG/5ML liquid   Oral   Take 200 mg by mouth every 6 (six) hours as needed for cough (NTE 4 doses).          Marland Kitchen. HYDROcodone-acetaminophen (NORCO/VICODIN) 5-325 MG per tablet   Oral   Take 1 tablet by mouth every 4 (four) hours as needed for moderate pain.   30 tablet   0   . levothyroxine (SYNTHROID, LEVOTHROID) 75 MCG tablet   Oral   Take 75 mcg by mouth daily before breakfast.         . loperamide (IMODIUM A-D) 2 MG tablet   Oral   Take 2 mg by mouth as needed for diarrhea or loose stools (Take one tablet with each loose stool. NTE 8 doses in 24 hours).          . magnesium hydroxide (MILK OF MAGNESIA) 400 MG/5ML suspension   Oral   Take 30 mLs by mouth at bedtime as needed for mild constipation or moderate constipation.          . Memantine HCl ER (NAMENDA XR) 28 MG CP24   Oral   Take 28 mg by mouth daily.         . metoprolol tartrate (LOPRESSOR) 25 MG tablet   Oral   Take 12.5 mg by mouth 2 (two) times daily.          Marland Kitchen.  oseltamivir (TAMIFLU) 75 MG capsule   Oral   Take 75 mg by mouth daily. For 10 days         . Vitamin D, Ergocalciferol, (DRISDOL) 50000 UNITS CAPS capsule   Oral   Take 50,000 Units by mouth every 7 (seven) days.          BP 153/87  Pulse 54  Temp(Src) 98.3 F (36.8 C) (Oral)  Resp 16  Ht 5\' 6"  (1.676 m)  Wt 180 lb (81.647 kg)  BMI 29.07 kg/m2  SpO2 95% Physical Exam  Nursing note and vitals reviewed. Constitutional: She appears well-developed and well-nourished. No distress.  HENT:  Head: Normocephalic and atraumatic.  Right Ear: External ear normal.  Left Ear: External ear normal.  Mouth/Throat: Oropharynx is clear and moist.  Eyes: Conjunctivae and EOM are normal. Pupils are equal, round, and reactive to light.  Neck: Normal range of motion. Neck supple.  Pt immobilized in c-collar  Cardiovascular: Normal rate and regular rhythm.   Pulmonary/Chest: Effort normal. She exhibits no tenderness, no bony tenderness and no crepitus.  Abdominal: Soft.  Musculoskeletal:       Thoracic back: She exhibits tenderness.       Back:  Neurological: She is alert. She has normal strength. No cranial nerve deficit.  Exam limited due to dementia  Skin: Skin is warm and dry.    ED Course  Procedures (including critical care time) Labs Review Labs Reviewed  BASIC METABOLIC PANEL - Abnormal; Notable for the following:    GFR calc non Af Amer 84 (*)    All other components within normal limits  CBC WITH DIFFERENTIAL   Imaging Review Dg Thoracic Spine 2 View  03/17/2013   CLINICAL DATA:  Fall.  Upper back pain.  EXAM: THORACIC SPINE - 2 VIEW  COMPARISON:  Chest radiography 05/26/2012. Cervical spine CT 03/02/2013  FINDINGS: The bones are osteopenic. There is kyphotic curvature. Appear appears to be slight loss of height of the T1 hand T3 vertebral bodies. Detail in the upper thoracic region is limited by shoulder density. There is chronic degenerative disc disease in the mid and  lower thoracic region.  IMPRESSION: Partial compression fractures at T1 hand T3 with with possible loss of height of about 25%. Detail is limited because of osteopenia and shoulder density. These findings were present on the recent CT examinations. CT would allow more accurate assessment of possible progression.   Electronically Signed   By: Paulina Fusi M.D.   On: 03/17/2013 14:05   Ct Cervical Spine Wo Contrast  03/17/2013   CLINICAL DATA:  Fall.  Hit head.  Neck pain  EXAM: CT CERVICAL SPINE WITHOUT CONTRAST  TECHNIQUE: Multidetector CT imaging of the cervical spine was performed without intravenous contrast. Multiplanar CT image reconstructions were also generated.  COMPARISON:  Cervical CT 03/02/2013  FINDINGS: There is a prominent thoracic kyphosis, unchanged. Moderate compression fracture of T1 is similar to the prior study and may be a subacute healing fracture. No acute cervical spine fracture  3 mm anterior slip C4-5 with disc and facet degeneration. Disc degeneration and spondylosis at C5-6 and C6-7. Degenerative change and C7-T1. The facet joints are fused from C3 through C5 bilaterally.  IMPRESSION: T1 compression fracture unchanged from 03/02/2013.  No new fracture.   Electronically Signed   By: Marlan Palau M.D.   On: 03/17/2013 13:42   Dg Hand Complete Right  03/17/2013   CLINICAL DATA:  Right hand pain. Bruising dorsally at metacarpals 1 through 3.  EXAM: RIGHT HAND - COMPLETE 3+ VIEW  COMPARISON:  No priors.  FINDINGS: Three views of the right hand demonstrate no acute displaced fracture or dislocation. There is marked joint space narrowing, subchondral sclerosis, subchondral cyst formation and osteophyte formation, most pronounced in the DIP and PIP joints, as well as the first MCP joint. At the first MCP joint in particular, there is proximal migration and lateral subluxation, presumably chronic. Additionally, there are advanced degenerative changes at the third MCP joint, with some volar  subluxation, and extensive erosions in the third metacarpal head.  IMPRESSION: 1. No acute abnormality of the right hand. 2. However, there are advanced degenerative changes, as above. The overall picture is one of advanced osteoarthritis. In addition, there are extensive degenerative changes at the third MCP joint which are unusual for typical osteoarthritis. This may represent sequela of prior inflammatory arthropathy, now with superimposed chronic degenerative changes of osteoarthritis at this time.   Electronically Signed   By: Trudie Reed M.D.   On: 03/17/2013 14:17    EKG Interpretation   None       MDM   1. Fall at nursing home    Images are all reassuring. Dr. Silverio Lay has seen patient as well. Patients nursing staff is with her and confirms she did not hit her head, therefore head CT not done with no signs of contusion and pt acting at baseline.  Pt can continue pain meds at home as needed.  80 y.o.Alysia Spillman's evaluation in the Emergency Department is complete. It has been determined that no acute conditions requiring further emergency intervention are present at this time. The patient/guardian have been advised of the diagnosis and plan. We have discussed signs and symptoms that warrant return to the ED, such as changes or worsening in symptoms.  Vital signs are stable at discharge. Filed Vitals:   03/17/13 1245  BP: 153/87  Pulse: 54  Temp: 98.3 F (36.8 C)  Resp: 16    Patient/guardian has voiced understanding and agreed to follow-up with the PCP or specialist.     Dorthula Matas, PA-C 03/17/13 1445

## 2013-03-20 NOTE — ED Provider Notes (Signed)
Medical screening examination/treatment/procedure(s) were conducted as a shared visit with non-physician practitioner(s) and myself.  I personally evaluated the patient during the encounter.  EKG Interpretation   None       Jillian Wilkerson is a 80 y.o. female hx of dementia from nursing home here with fall. Witnessed fall while trying to sit down. No syncope. She had some neck pain and R hand pain afterwards. No head injury. CT cervical spine and xrays nl. Stable for d/c back to nursing home.    Richardean Canalavid H Laniesha Das, MD 03/20/13 1024

## 2013-05-26 ENCOUNTER — Emergency Department (HOSPITAL_COMMUNITY)
Admission: EM | Admit: 2013-05-26 | Discharge: 2013-05-26 | Disposition: A | Discharge status: Home / Self Care | Payer: Medicare Other | Attending: Emergency Medicine | Admitting: Emergency Medicine

## 2013-05-26 ENCOUNTER — Emergency Department (HOSPITAL_COMMUNITY): Payer: Medicare Other

## 2013-05-26 ENCOUNTER — Encounter (HOSPITAL_COMMUNITY): Payer: Self-pay | Admitting: Emergency Medicine

## 2013-05-26 DIAGNOSIS — F0280 Dementia in other diseases classified elsewhere without behavioral disturbance: Secondary | ICD-10-CM | POA: Insufficient documentation

## 2013-05-26 DIAGNOSIS — G309 Alzheimer's disease, unspecified: Secondary | ICD-10-CM | POA: Insufficient documentation

## 2013-05-26 DIAGNOSIS — Z79899 Other long term (current) drug therapy: Secondary | ICD-10-CM | POA: Insufficient documentation

## 2013-05-26 DIAGNOSIS — Z7982 Long term (current) use of aspirin: Secondary | ICD-10-CM | POA: Insufficient documentation

## 2013-05-26 DIAGNOSIS — H9190 Unspecified hearing loss, unspecified ear: Secondary | ICD-10-CM | POA: Insufficient documentation

## 2013-05-26 DIAGNOSIS — Z87448 Personal history of other diseases of urinary system: Secondary | ICD-10-CM | POA: Insufficient documentation

## 2013-05-26 DIAGNOSIS — G8929 Other chronic pain: Secondary | ICD-10-CM | POA: Insufficient documentation

## 2013-05-26 DIAGNOSIS — Z87891 Personal history of nicotine dependence: Secondary | ICD-10-CM | POA: Insufficient documentation

## 2013-05-26 DIAGNOSIS — E0590 Thyrotoxicosis, unspecified without thyrotoxic crisis or storm: Secondary | ICD-10-CM | POA: Insufficient documentation

## 2013-05-26 DIAGNOSIS — N390 Urinary tract infection, site not specified: Secondary | ICD-10-CM | POA: Insufficient documentation

## 2013-05-26 DIAGNOSIS — F3289 Other specified depressive episodes: Secondary | ICD-10-CM | POA: Insufficient documentation

## 2013-05-26 DIAGNOSIS — Z880 Allergy status to penicillin: Secondary | ICD-10-CM | POA: Insufficient documentation

## 2013-05-26 DIAGNOSIS — J45909 Unspecified asthma, uncomplicated: Secondary | ICD-10-CM | POA: Insufficient documentation

## 2013-05-26 DIAGNOSIS — F411 Generalized anxiety disorder: Secondary | ICD-10-CM | POA: Insufficient documentation

## 2013-05-26 DIAGNOSIS — M810 Age-related osteoporosis without current pathological fracture: Secondary | ICD-10-CM | POA: Insufficient documentation

## 2013-05-26 DIAGNOSIS — F329 Major depressive disorder, single episode, unspecified: Secondary | ICD-10-CM | POA: Insufficient documentation

## 2013-05-26 LAB — CBC WITH DIFFERENTIAL/PLATELET
Basophils Absolute: 0.1 10*3/uL (ref 0.0–0.1)
Basophils Relative: 1 % (ref 0–1)
EOS ABS: 0.4 10*3/uL (ref 0.0–0.7)
EOS PCT: 5 % (ref 0–5)
HCT: 42.3 % (ref 36.0–46.0)
HEMOGLOBIN: 13.7 g/dL (ref 12.0–15.0)
LYMPHS ABS: 2.2 10*3/uL (ref 0.7–4.0)
Lymphocytes Relative: 28 % (ref 12–46)
MCH: 29.3 pg (ref 26.0–34.0)
MCHC: 32.4 g/dL (ref 30.0–36.0)
MCV: 90.4 fL (ref 78.0–100.0)
MONOS PCT: 8 % (ref 3–12)
Monocytes Absolute: 0.7 10*3/uL (ref 0.1–1.0)
Neutro Abs: 4.5 10*3/uL (ref 1.7–7.7)
Neutrophils Relative %: 57 % (ref 43–77)
Platelets: 219 10*3/uL (ref 150–400)
RBC: 4.68 MIL/uL (ref 3.87–5.11)
RDW: 14.7 % (ref 11.5–15.5)
WBC: 7.9 10*3/uL (ref 4.0–10.5)

## 2013-05-26 LAB — BASIC METABOLIC PANEL
BUN: 7 mg/dL (ref 6–23)
CALCIUM: 9.7 mg/dL (ref 8.4–10.5)
CO2: 26 mEq/L (ref 19–32)
Chloride: 104 mEq/L (ref 96–112)
Creatinine, Ser: 0.56 mg/dL (ref 0.50–1.10)
GFR calc Af Amer: >90 mL/min (ref >90)
GFR, EST NON AFRICAN AMERICAN: 82 mL/min — AB (ref >90)
GLUCOSE: 89 mg/dL (ref 70–99)
Potassium: 3.9 mEq/L (ref 3.7–5.3)
Sodium: 143 mEq/L (ref 137–147)

## 2013-05-26 LAB — URINE MICROSCOPIC-ADD ON

## 2013-05-26 LAB — URINALYSIS, ROUTINE W REFLEX MICROSCOPIC
Bilirubin Urine: NEGATIVE
Glucose, UA: NEGATIVE mg/dL
Ketones, ur: NEGATIVE mg/dL
Nitrite: NEGATIVE
PROTEIN: NEGATIVE mg/dL
SPECIFIC GRAVITY, URINE: 1.005 (ref 1.005–1.030)
UROBILINOGEN UA: 0.2 mg/dL (ref 0.0–1.0)
pH: 6 (ref 5.0–8.0)

## 2013-05-26 MED ORDER — CIPROFLOXACIN HCL 250 MG PO TABS: 250.0000 mg | ORAL_TABLET | Freq: Two times a day (BID) | ORAL | Status: DC

## 2013-05-26 MED ORDER — CIPROFLOXACIN IN D5W 400 MG/200ML IV SOLN
400.0000 mg | Freq: Once | INTRAVENOUS | Status: AC
  Administered 2013-05-26: 400 mg via INTRAVENOUS
  Filled 2013-05-26: qty 200

## 2013-05-26 NOTE — ED Notes (Signed)
[facility] with complaints of altered mental status. Pt was walking and almost fell but did not. Staff reports that she is less "chatty" than usual but pt has a hx of dementia. Staff wants pt to be checked for a UTI.

## 2013-05-26 NOTE — ED Provider Notes (Signed)
CSN: [id number]     Arrival date & time 05/26/13  1826 History  First MD Initiated Contact with Patient 05/26/13 1830     Chief Complaint  Patient presents with  . Altered Mental Status   HPI Patient presents to the emergency room for evaluation of altered mental status. The patient is a resident of a nursing facility. She has history of Alzheimer's. According to EMS, the patient was walking and stumbled. Staff is concerned about her because she is not as talkative as she usually is. They were concerned about the possibility of a urinary tract infection. Patient denies any complaints.  He's not having trouble with chest pain, headache, shortness of breath, abdominal pain.  Patient denies any dysuria. She denies any fevers or chills Past Medical History  Diagnosis Date  . Dementia   . Alzheimer disease   . Osteoporosis   . Hearing loss   . Urinary incontinence   . Asthma   . Alzheimer's dementia   . Chronic back pain   . Urinary incontinence   . Osteoporosis   . Gait instability   . Depression   . Anxiety   . Hyperthyroidism   . Hearing loss   . Prolapsing mitral leaflet syndrome    History reviewed. No pertinent past surgical history. Family History  Problem Relation Age of Onset  . Hypertension Father    History  Substance Use Topics  . Smoking status: Former Games developer  . Smokeless tobacco: Never Used  . Alcohol Use: No   OB History   Grav Para Term Preterm Abortions TAB SAB Ect Mult Living                 Review of Systems  All other systems reviewed and are negative.      Allergies  Codeine and Penicillins  Home Medications   Current Outpatient Rx  Name  Route  Sig  Dispense  Refill  . acetaminophen (TYLENOL) 500 MG tablet   Oral   Take 500 mg by mouth every 4 (four) hours as needed for mild pain, fever or headache.          . albuterol (PROVENTIL) (2.5 MG/3ML) 0.083% nebulizer solution   Nebulization   Take 2.5 mg by nebulization every 6 (six) hours  as needed for wheezing.         Marland Kitchen alendronate (FOSAMAX) 70 MG tablet   Oral   Take 70 mg by mouth every 7 (seven) days. Take with a full glass of water on an empty stomach.         Marland Kitchen alum & mag hydroxide-simeth (MAALOX/MYLANTA) 200-200-20 MG/5ML suspension   Oral   Take 30 mLs by mouth every 6 (six) hours as needed for indigestion or heartburn (NTE 4 doses in 24 hours).          Marland Kitchen aspirin EC 81 MG tablet   Oral   Take 81 mg by mouth daily.         . benzonatate (TESSALON) 100 MG capsule   Oral   Take 100 mg by mouth 3 (three) times daily as needed for cough.         . docusate sodium (COLACE) 100 MG capsule   Oral   Take 100 mg by mouth 2 (two) times daily.         Marland Kitchen donepezil (ARICEPT) 10 MG tablet   Oral   Take 10 mg by mouth at bedtime.         Marland Kitchen escitalopram (  LEXAPRO) 20 MG tablet   Oral   Take 20 mg by mouth daily.         . furosemide (LASIX) 20 MG tablet   Oral   Take 20 mg by mouth 2 (two) times daily.         Marland Kitchen. GLUCOSAMINE CHONDROITIN COMPLX PO   Oral   Take 1 tablet by mouth 3 (three) times daily.         Marland Kitchen. guaiFENesin (ROBITUSSIN) 100 MG/5ML liquid   Oral   Take 200 mg by mouth every 6 (six) hours as needed for cough (NTE 4 doses).          Marland Kitchen. HYDROcodone-acetaminophen (NORCO/VICODIN) 5-325 MG per tablet   Oral   Take 1 tablet by mouth every 4 (four) hours as needed for moderate pain.   30 tablet   0   . levothyroxine (SYNTHROID, LEVOTHROID) 75 MCG tablet   Oral   Take 75 mcg by mouth daily before breakfast.         . loperamide (IMODIUM A-D) 2 MG tablet   Oral   Take 2 mg by mouth as needed for diarrhea or loose stools (Take one tablet with each loose stool. NTE 8 doses in 24 hours).          . magnesium hydroxide (MILK OF MAGNESIA) 400 MG/5ML suspension   Oral   Take 30 mLs by mouth at bedtime as needed for mild constipation or moderate constipation.          . Memantine HCl ER (NAMENDA XR) 28 MG CP24   Oral   Take  28 mg by mouth daily.         . metoprolol tartrate (LOPRESSOR) 25 MG tablet   Oral   Take 12.5 mg by mouth 2 (two) times daily.          . Vitamin D, Ergocalciferol, (DRISDOL) 50000 UNITS CAPS capsule   Oral   Take 50,000 Units by mouth every 7 (seven) days.          BP 142/83  Pulse 55  Temp(Src) 97.3 F (36.3 C) (Oral)  Resp 14  SpO2 94% Physical Exam  Nursing note and vitals reviewed. Constitutional: No distress.  HENT:  Head: Normocephalic and atraumatic.  Right Ear: External ear normal.  Left Ear: External ear normal.  Eyes: Conjunctivae are normal. Right eye exhibits no discharge. Left eye exhibits no discharge. No scleral icterus.  Neck: Neck supple. No tracheal deviation present.  Cardiovascular: Normal rate, regular rhythm and intact distal pulses.   Pulmonary/Chest: Effort normal and breath sounds normal. No stridor. No respiratory distress. She has no wheezes. She has no rales.  Abdominal: Soft. Bowel sounds are normal. She exhibits no distension. There is no tenderness. There is no rebound and no guarding.  Musculoskeletal: She exhibits no edema and no tenderness.  Neurological: She is alert. She has normal strength. She is disoriented. No cranial nerve deficit (no facial droop, extraocular movements intact, no slurred speech) or sensory deficit. She exhibits normal muscle tone. She displays no seizure activity. Coordination normal.  Oriented to person and place, follows all commands, equal grip strength and plantar flexion strength  Skin: Skin is warm and dry. No rash noted. She is not diaphoretic.  Psychiatric: She has a normal mood and affect.    ED Course  Procedures (including critical care time) Labs Review Labs Reviewed  BASIC METABOLIC PANEL - Abnormal; Notable for the following:    GFR calc non Af  Amer 59 (*)    All other components within normal limits  URINALYSIS, ROUTINE W REFLEX MICROSCOPIC - Abnormal; Notable for the following:     APPearance CLOUDY (*)    Hgb urine dipstick TRACE (*)    Leukocytes, UA LARGE (*)    All other components within normal limits  CBC WITH DIFFERENTIAL  URINE MICROSCOPIC-ADD ON   Imaging Review Ct Head Wo Contrast  05/26/2013   CLINICAL DATA:  Altered level of consciousness.  EXAM: CT HEAD WITHOUT CONTRAST  TECHNIQUE: Contiguous axial images were obtained from the base of the skull through the vertex without intravenous contrast.  COMPARISON:  CT C SPINE W/O CM dated 03/17/2013; CT HEAD W/O CM dated 03/02/2013  FINDINGS: The right MCA seems slightly more dense than the left, but I suspect that this is due to fortuitous imaging cut rather than a true asymmetry.  Periventricular white matter and corona radiata hypodensities favor chronic ischemic microvascular white matter disease. No intracranial hemorrhage, mass lesion, or acute CVA. No hydrocephalus or significant change from the prior exam. .  IMPRESSION: 1. No acute intracranial findings. 2. Periventricular white matter and corona radiata hypodensities favor chronic ischemic microvascular white matter disease.   Electronically Signed   By: Herbie Baltimore M.D.   On: 05/26/2013 19:33   Medications  ciprofloxacin (CIPRO) IVPB 400 mg (400 mg Intravenous New Bag/Given 05/26/13 2218)     EKG Normal sinus rhythm rate 52 Prolonged PR interval Inferior Q waves No prior EKG for comparison  MDM   Final diagnoses:  UTI (urinary tract infection)   UA is consistent with a UTI.  Dose of cipro given in the ED  (pcn allergy).  Will dc back to nursing facility with rx for abx.  Pt remains alert and in no distress.  Discussed the findings with the patient.    Celene Kras, MD 05/26/13 [phone]

## 2013-05-26 NOTE — Progress Notes (Signed)
CSW met with patient at bedside.  Patient reports feeling sleepy but was willing to engage in a conversation with CSW.  Patient reports that she is feeling better only sleepy. Patient reports that she lives alone and her son plus ex-husband checks on her at the least one time a week.  At this time the patient is a poor historian.  Patient is a resident of [facility].  CSW called and spoke with [place] with the [facility] to collect collateral information.  The patient resides on the memory care unit and it is the intent to return once medical clearance.       Chesley Noon, MSW, [place], 05/26/2013 Evening Clinical Social Worker [phone]

## 2013-05-26 NOTE — Discharge Instructions (Signed)
Urinary Tract Infection °A urinary tract infection (UTI) can occur any place along the urinary tract. The tract includes the kidneys, ureters, bladder, and urethra. A type of germ called bacteria often causes a UTI. UTIs are often helped with antibiotic medicine.  °HOME CARE  °· If given, take antibiotics as told by your doctor. Finish them even if you start to feel better. °· Drink enough fluids to keep your pee (urine) clear or pale yellow. °· Avoid tea, drinks with caffeine, and bubbly (carbonated) drinks. °· Pee often. Avoid holding your pee in for a long time. °· Pee before and after having sex (intercourse). °· Wipe from front to back after you poop (bowel movement) if you are a woman. Use each tissue only once. °GET HELP RIGHT AWAY IF:  °· You have back pain. °· You have lower belly (abdominal) pain. °· You have chills. °· You feel sick to your stomach (nauseous). °· You throw up (vomit). °· Your burning or discomfort with peeing does not go away. °· You have a fever. °· Your symptoms are not better in 3 days. °MAKE SURE YOU:  °· Understand these instructions. °· Will watch your condition. °· Will get help right away if you are not doing well or get worse. °Document Released: 08/05/2007 Document Revised: 11/11/2011 Document Reviewed: 09/17/2011 °Exit[employer]® Patient Information ©2014 [employer]. ° °

## 2013-05-26 NOTE — ED Notes (Signed)
Bed: WA06 Expected date:  Expected time:  Means of arrival:  Comments: EMS- eval for UTI

## 2013-06-15 ENCOUNTER — Emergency Department (HOSPITAL_COMMUNITY): Payer: Medicare Other

## 2013-06-15 ENCOUNTER — Encounter (HOSPITAL_COMMUNITY): Payer: Self-pay | Admitting: Emergency Medicine

## 2013-06-15 ENCOUNTER — Inpatient Hospital Stay (HOSPITAL_COMMUNITY)
Admission: EM | Admit: 2013-06-15 | Discharge: 2013-06-20 | DRG: 871 | Disposition: A | Discharge status: Hospice | Payer: Medicare Other | Attending: Internal Medicine | Admitting: Internal Medicine

## 2013-06-15 DIAGNOSIS — E039 Hypothyroidism, unspecified: Secondary | ICD-10-CM | POA: Diagnosis present

## 2013-06-15 DIAGNOSIS — F329 Major depressive disorder, single episode, unspecified: Secondary | ICD-10-CM | POA: Diagnosis present

## 2013-06-15 DIAGNOSIS — F0390 Unspecified dementia without behavioral disturbance: Secondary | ICD-10-CM | POA: Diagnosis present

## 2013-06-15 DIAGNOSIS — D6489 Other specified anemias: Secondary | ICD-10-CM | POA: Diagnosis present

## 2013-06-15 DIAGNOSIS — R52 Pain, unspecified: Secondary | ICD-10-CM

## 2013-06-15 DIAGNOSIS — G309 Alzheimer's disease, unspecified: Secondary | ICD-10-CM | POA: Diagnosis present

## 2013-06-15 DIAGNOSIS — Z515 Encounter for palliative care: Secondary | ICD-10-CM

## 2013-06-15 DIAGNOSIS — F3289 Other specified depressive episodes: Secondary | ICD-10-CM | POA: Diagnosis present

## 2013-06-15 DIAGNOSIS — Z888 Allergy status to other drugs, medicaments and biological substances status: Secondary | ICD-10-CM

## 2013-06-15 DIAGNOSIS — R6520 Severe sepsis without septic shock: Secondary | ICD-10-CM

## 2013-06-15 DIAGNOSIS — IMO0002 Reserved for concepts with insufficient information to code with codable children: Secondary | ICD-10-CM | POA: Diagnosis not present

## 2013-06-15 DIAGNOSIS — H9190 Unspecified hearing loss, unspecified ear: Secondary | ICD-10-CM | POA: Diagnosis present

## 2013-06-15 DIAGNOSIS — Z66 Do not resuscitate: Secondary | ICD-10-CM | POA: Diagnosis present

## 2013-06-15 DIAGNOSIS — I7101 Dissection of ascending aorta: Secondary | ICD-10-CM

## 2013-06-15 DIAGNOSIS — Z8249 Family history of ischemic heart disease and other diseases of the circulatory system: Secondary | ICD-10-CM

## 2013-06-15 DIAGNOSIS — N390 Urinary tract infection, site not specified: Secondary | ICD-10-CM | POA: Diagnosis present

## 2013-06-15 DIAGNOSIS — M810 Age-related osteoporosis without current pathological fracture: Secondary | ICD-10-CM | POA: Diagnosis present

## 2013-06-15 DIAGNOSIS — R0600 Dyspnea, unspecified: Secondary | ICD-10-CM

## 2013-06-15 DIAGNOSIS — I7100 Dissection of unspecified site of aorta: Secondary | ICD-10-CM | POA: Diagnosis present

## 2013-06-15 DIAGNOSIS — Z9089 Acquired absence of other organs: Secondary | ICD-10-CM

## 2013-06-15 DIAGNOSIS — F0280 Dementia in other diseases classified elsewhere without behavioral disturbance: Secondary | ICD-10-CM | POA: Diagnosis present

## 2013-06-15 DIAGNOSIS — Z87891 Personal history of nicotine dependence: Secondary | ICD-10-CM

## 2013-06-15 DIAGNOSIS — F411 Generalized anxiety disorder: Secondary | ICD-10-CM | POA: Diagnosis present

## 2013-06-15 DIAGNOSIS — R000 Tachycardia, unspecified: Secondary | ICD-10-CM | POA: Diagnosis present

## 2013-06-15 DIAGNOSIS — Z79899 Other long term (current) drug therapy: Secondary | ICD-10-CM

## 2013-06-15 DIAGNOSIS — Z880 Allergy status to penicillin: Secondary | ICD-10-CM

## 2013-06-15 DIAGNOSIS — A419 Sepsis, unspecified organism: Principal | ICD-10-CM | POA: Diagnosis present

## 2013-06-15 DIAGNOSIS — J45909 Unspecified asthma, uncomplicated: Secondary | ICD-10-CM | POA: Diagnosis present

## 2013-06-15 HISTORY — DX: Hypothyroidism, unspecified: E03.9

## 2013-06-15 LAB — CBC WITH DIFFERENTIAL/PLATELET
Basophils Absolute: 0.1 10*3/uL (ref 0.0–0.1)
Basophils Relative: 0 % (ref 0–1)
Eosinophils Absolute: 0 10*3/uL (ref 0.0–0.7)
Eosinophils Relative: 0 % (ref 0–5)
HCT: 32.6 % — ABNORMAL LOW (ref 36.0–46.0)
Hemoglobin: 10.1 g/dL — ABNORMAL LOW (ref 12.0–15.0)
Lymphocytes Relative: 8 % — ABNORMAL LOW (ref 12–46)
Lymphs Abs: 1 10*3/uL (ref 0.7–4.0)
MCH: 28.8 pg (ref 26.0–34.0)
MCHC: 31 g/dL (ref 30.0–36.0)
MCV: 92.9 fL (ref 78.0–100.0)
Monocytes Absolute: 1 10*3/uL (ref 0.1–1.0)
Monocytes Relative: 8 % (ref 3–12)
Neutro Abs: 10.1 10*3/uL — ABNORMAL HIGH (ref 1.7–7.7)
Neutrophils Relative %: 84 % — ABNORMAL HIGH (ref 43–77)
Platelets: 189 10*3/uL (ref 150–400)
RBC: 3.51 MIL/uL — ABNORMAL LOW (ref 3.87–5.11)
RDW: 15.7 % — ABNORMAL HIGH (ref 11.5–15.5)
WBC: 12.2 10*3/uL — ABNORMAL HIGH (ref 4.0–10.5)

## 2013-06-15 LAB — URINALYSIS, ROUTINE W REFLEX MICROSCOPIC
Glucose, UA: 100 mg/dL — AB
Ketones, ur: NEGATIVE mg/dL
Nitrite: NEGATIVE
Protein, ur: >300 mg/dL — AB
Specific Gravity, Urine: 1.018 (ref 1.005–1.030)
Urobilinogen, UA: 4 mg/dL — ABNORMAL HIGH (ref 0.0–1.0)
pH: 7 (ref 5.0–8.0)

## 2013-06-15 LAB — ABO/RH: ABO/RH(D): A NEG

## 2013-06-15 LAB — BASIC METABOLIC PANEL
BUN: 19 mg/dL (ref 6–23)
CO2: 17 mEq/L — ABNORMAL LOW (ref 19–32)
Calcium: 8.4 mg/dL (ref 8.4–10.5)
Chloride: 100 mEq/L (ref 96–112)
Creatinine, Ser: 0.99 mg/dL (ref 0.50–1.10)
GFR calc Af Amer: 58 mL/min — ABNORMAL LOW (ref >90)
GFR calc non Af Amer: 50 mL/min — ABNORMAL LOW (ref >90)
Glucose, Bld: 172 mg/dL — ABNORMAL HIGH (ref 70–99)
Potassium: 3.7 mEq/L (ref 3.7–5.3)
Sodium: 143 mEq/L (ref 137–147)

## 2013-06-15 LAB — TYPE AND SCREEN
ABO/RH(D): A NEG
Antibody Screen: NEGATIVE

## 2013-06-15 LAB — URINE MICROSCOPIC-ADD ON

## 2013-06-15 LAB — LACTIC ACID, PLASMA: Lactic Acid, Venous: 8.1 mmol/L — ABNORMAL HIGH (ref 0.5–2.2)

## 2013-06-15 LAB — TROPONIN I: Troponin I: <0.3 ng/mL (ref <0.30)

## 2013-06-15 LAB — MAGNESIUM: Magnesium: 1.9 mg/dL (ref 1.5–2.5)

## 2013-06-15 MED ORDER — MEMANTINE HCL ER 28 MG PO CP24
28.0000 mg | ORAL_CAPSULE | Freq: Every day | ORAL | Status: DC
  Filled 2013-06-15: qty 28

## 2013-06-15 MED ORDER — SODIUM CHLORIDE 0.9 % IV BOLUS (SEPSIS)
1000.0000 mL | Freq: Once | INTRAVENOUS | Status: AC
  Administered 2013-06-15: 1000 mL via INTRAVENOUS

## 2013-06-15 MED ORDER — DEXTROSE 5 % IV SOLN
1.0000 g | Freq: Three times a day (TID) | INTRAVENOUS | Status: DC
  Administered 2013-06-16: 1 g via INTRAVENOUS
  Filled 2013-06-15 (×3): qty 1

## 2013-06-15 MED ORDER — ACETAMINOPHEN 325 MG PO TABS
650.0000 mg | ORAL_TABLET | Freq: Four times a day (QID) | ORAL | Status: DC | PRN
  Filled 2013-06-15: qty 2

## 2013-06-15 MED ORDER — LEVOTHYROXINE SODIUM 75 MCG PO TABS
75.0000 ug | ORAL_TABLET | Freq: Every day | ORAL | Status: DC
  Administered 2013-06-16: 75 ug via ORAL
  Filled 2013-06-15 (×2): qty 1

## 2013-06-15 MED ORDER — VANCOMYCIN HCL IN DEXTROSE 750-5 MG/150ML-% IV SOLN
750.0000 mg | Freq: Two times a day (BID) | INTRAVENOUS | Status: DC
  Administered 2013-06-16: 750 mg via INTRAVENOUS
  Filled 2013-06-15 (×2): qty 150

## 2013-06-15 MED ORDER — ESCITALOPRAM OXALATE 20 MG PO TABS
20.0000 mg | ORAL_TABLET | Freq: Every day | ORAL | Status: DC
  Filled 2013-06-15: qty 1

## 2013-06-15 MED ORDER — ACETAMINOPHEN 650 MG RE SUPP
650.0000 mg | Freq: Once | RECTAL | Status: AC
  Administered 2013-06-15: 650 mg via RECTAL
  Filled 2013-06-15: qty 1

## 2013-06-15 MED ORDER — DEXTROSE 5 % IV SOLN
2.0000 g | Freq: Once | INTRAVENOUS | Status: AC
  Administered 2013-06-15: 2 g via INTRAVENOUS

## 2013-06-15 MED ORDER — MORPHINE SULFATE 2 MG/ML IJ SOLN: 1.0000 mg | INTRAMUSCULAR | Status: DC | PRN

## 2013-06-15 MED ORDER — ACETAMINOPHEN 650 MG RE SUPP: 650.0000 mg | Freq: Four times a day (QID) | RECTAL | Status: DC | PRN

## 2013-06-15 MED ORDER — ONDANSETRON HCL 4 MG/2ML IJ SOLN: 4.0000 mg | Freq: Four times a day (QID) | INTRAMUSCULAR | Status: DC | PRN

## 2013-06-15 MED ORDER — MIRTAZAPINE 15 MG PO TABS
15.0000 mg | ORAL_TABLET | Freq: Every day | ORAL | Status: DC
  Administered 2013-06-16: 15 mg via ORAL
  Filled 2013-06-15 (×2): qty 1

## 2013-06-15 MED ORDER — FUROSEMIDE 20 MG PO TABS
20.0000 mg | ORAL_TABLET | Freq: Two times a day (BID) | ORAL | Status: DC
  Administered 2013-06-16: 20 mg via ORAL
  Filled 2013-06-15 (×3): qty 1

## 2013-06-15 MED ORDER — VANCOMYCIN HCL IN DEXTROSE 1-5 GM/200ML-% IV SOLN
1000.0000 mg | Freq: Once | INTRAVENOUS | Status: AC
  Administered 2013-06-15: 1000 mg via INTRAVENOUS
  Filled 2013-06-15: qty 200

## 2013-06-15 MED ORDER — DONEPEZIL HCL 10 MG PO TABS
10.0000 mg | ORAL_TABLET | Freq: Every day | ORAL | Status: DC
  Administered 2013-06-16: 10 mg via ORAL
  Filled 2013-06-15 (×2): qty 1

## 2013-06-15 MED ORDER — DOCUSATE SODIUM 100 MG PO CAPS
100.0000 mg | ORAL_CAPSULE | Freq: Two times a day (BID) | ORAL | Status: DC
  Filled 2013-06-15 (×2): qty 1

## 2013-06-15 MED ORDER — ONDANSETRON HCL 4 MG PO TABS: 4.0000 mg | ORAL_TABLET | Freq: Four times a day (QID) | ORAL | Status: DC | PRN

## 2013-06-15 MED ORDER — SODIUM CHLORIDE 0.9 % IV SOLN
INTRAVENOUS | Status: AC
  Administered 2013-06-16 (×2): via INTRAVENOUS

## 2013-06-15 NOTE — ED Notes (Signed)
Per ems-- [facility] [address] Staff reports pt is normally active but has not been for past couple days and today o2 stats were in 70's, and she was pale. Upon ems arrival pt tachypenic and pale. o2 stats have been going up and down (70-100%). Pt on 4 L [place]. And has received 150cc NS. Pt fingers cold. Pt seems to be improving with fluid. Pt is tachycardic as well-130's.

## 2013-06-15 NOTE — Progress Notes (Signed)
ANTIBIOTIC CONSULT NOTE - INITIAL  Pharmacy Consult for vancomycin + cefepime Indication: rule out sepsis  Allergies  Allergen Reactions  . Codeine Other (See Comments)    Labored breathing  . Penicillins Rash    Patient Measurements:   Adjusted Body Weight:   Vital Signs: Temp: 100.8 F (38.2 C) (04/16 1839) Temp src: Rectal (04/16 1839) BP: 91/62 mmHg (04/16 1917) Pulse Rate: 125 (04/16 1834) Intake/Output from previous day:   Intake/Output from this shift:    Labs:  Recent Labs  06/15/13 1836  WBC 12.2*  HGB 10.1*  PLT 189  CREATININE 0.99   The CrCl is unknown because both a height and weight (above a minimum accepted value) are required for this calculation. No results found for this basename: VANCOTROUGH, VANCOPEAK, VANCORANDOM, GENTTROUGH, GENTPEAK, GENTRANDOM, TOBRATROUGH, TOBRAPEAK, TOBRARND, AMIKACINPEAK, AMIKACINTROU, AMIKACIN,  in the last 72 hours   Microbiology: No results found for this or any previous visit (from the past 720 hour(s)).  Medical History: Past Medical History  Diagnosis Date  . Dementia   . Alzheimer disease   . Osteoporosis   . Hearing loss   . Urinary incontinence   . Asthma   . Alzheimer's dementia   . Chronic back pain   . Urinary incontinence   . Osteoporosis   . Gait instability   . Depression   . Anxiety   . Hyperthyroidism   . Hearing loss   . Prolapsing mitral leaflet syndrome     Medications:  Anti-infectives   Start     Dose/Rate Route Frequency Ordered Stop   06/16/13 0800  vancomycin (VANCOCIN) IVPB 750 mg/150 ml premix     750 mg 150 mL/hr over 60 Minutes Intravenous Every 12 hours 06/15/13 2019     06/16/13 0500  ceFEPIme (MAXIPIME) 1 g in dextrose 5 % 50 mL IVPB     1 g 100 mL/hr over 30 Minutes Intravenous Every 8 hours 06/15/13 2017     06/15/13 1945  ceFEPIme (MAXIPIME) 2 g in dextrose 5 % 50 mL IVPB     2 g 100 mL/hr over 30 Minutes Intravenous  Once 06/15/13 1937     06/15/13 1915   vancomycin (VANCOCIN) IVPB 1000 mg/200 mL premix     1,000 mg 200 mL/hr over 60 Minutes Intravenous  Once 06/15/13 1912 06/15/13 2014     Assessment: 88 yof presented to the ED with low O2 sats from NH. To start empiric vancomycin + cefepime for possible sepsis. Pt with Tmax of 100.8, WBC is 12.2. Scr is WNL and lactic acid is elevated at 8.1.   Vanc 4/16>> Cefepime 4/16>>  Goal of Therapy:  Vancomycin trough level 15-20 mcg/ml  Plan:  1. Vancomycin 1gm IV x 1 then 750mg  IV Q12h 2. Cefepime 2gm IV x 1 then 1gm IV Q8H 3. F/u renal fxn, C&S, clinical status and trough at [employer]  Filomena Pokorney Lynn Eulis Salazar 06/15/2013,8:19 PM

## 2013-06-15 NOTE — ED Notes (Signed)
MD at bedside attempting ultrasound IV

## 2013-06-15 NOTE — ED Notes (Signed)
MD at bedside attempting US IV

## 2013-06-15 NOTE — Progress Notes (Signed)
Called and received report from [place], Charity fundraiserN.

## 2013-06-15 NOTE — ED Notes (Addendum)
IV infiltrated prior to CT study. Pt took back to ER room. Pt BP in R arm- 65/49 and in L arm- 114/78. MD notified.

## 2013-06-15 NOTE — ED Provider Notes (Signed)
CSN: [id number]     Arrival date & time 06/15/13  1755 History   First MD Initiated Contact with Patient 06/15/13 1816     Chief Complaint  Patient presents with  . Weakness  . Shortness of Breath     (Consider location/radiation/quality/duration/timing/severity/associated sxs/prior Treatment) HPI  80 year old female brought from nursing home for evaluation decreased mental status. Patient has apparently had a decline over the past 2-3 days. No report of trauma. No fever. No report of vomiting or diarrhea. Appeared pale to staff and oxygen saturations were in the 70s. On EMS arrival patient in with tachypnea and tachycardic. Patient is confused. She cannot provide much useful history. She is awake and talking but appears tired. Her speech is clear but content is mostly inappropriate for questions asked.   Past Medical History  Diagnosis Date  . Dementia   . Alzheimer disease   . Osteoporosis   . Hearing loss   . Urinary incontinence   . Asthma   . Alzheimer's dementia   . Chronic back pain   . Urinary incontinence   . Osteoporosis   . Gait instability   . Depression   . Anxiety   . Hyperthyroidism   . Hearing loss   . Prolapsing mitral leaflet syndrome    History reviewed. No pertinent past surgical history. Family History  Problem Relation Age of Onset  . Hypertension Father    History  Substance Use Topics  . Smoking status: Former Games developermoker  . Smokeless tobacco: Never Used  . Alcohol Use: No   OB History   Grav Para Term Preterm Abortions TAB SAB Ect Mult Living                 Review of Systems  Level 5 caveat because patient is confused and critically ill.  Allergies  Codeine and Penicillins  Home Medications   Prior to Admission medications   Medication Sig Start Date End Date Taking? Authorizing Provider  alendronate (FOSAMAX) 70 MG tablet Take 70 mg by mouth every 7 (seven) days. Take with a full glass of water on an empty stomach.   Yes Historical  Provider, MD  aspirin EC 81 MG tablet Take 81 mg by mouth daily.   Yes Historical Provider, MD  docusate sodium (COLACE) 100 MG capsule Take 100 mg by mouth 2 (two) times daily.   Yes Historical Provider, MD  donepezil (ARICEPT) 10 MG tablet Take 10 mg by mouth at bedtime.   Yes Historical Provider, MD  escitalopram (LEXAPRO) 20 MG tablet Take 20 mg by mouth daily.   Yes Historical Provider, MD  furosemide (LASIX) 20 MG tablet Take 20 mg by mouth 2 (two) times daily.   Yes Historical Provider, MD  GLUCOSAMINE CHONDROITIN COMPLX PO Take 1 tablet by mouth 3 (three) times daily.   Yes Historical Provider, MD  levothyroxine (SYNTHROID, LEVOTHROID) 75 MCG tablet Take 75 mcg by mouth daily before breakfast.   Yes Historical Provider, MD  Memantine HCl ER (NAMENDA XR) 28 MG CP24 Take 28 mg by mouth daily.   Yes Historical Provider, MD  metoprolol tartrate (LOPRESSOR) 25 MG tablet Take 12.5 mg by mouth 2 (two) times daily.    Yes Historical Provider, MD  mirtazapine (REMERON) 15 MG tablet Take 15 mg by mouth at bedtime.   Yes Historical Provider, MD  Vitamin D, Ergocalciferol, (DRISDOL) 50000 UNITS CAPS capsule Take 50,000 Units by mouth every 7 (seven) days.   Yes Historical Provider, MD   BP [phone]  Pulse 125  Temp(Src) 100.8 F (38.2 C) (Rectal)  Resp 24  SpO2 91% Physical Exam  Nursing note and vitals reviewed. Constitutional: She appears distressed.  Pale. Ill appearing.   HENT:  Head: Normocephalic and atraumatic.  Eyes: Conjunctivae are normal. Right eye exhibits no discharge. Left eye exhibits no discharge.  Neck: Neck supple.  Cardiovascular: Regular rhythm and normal heart sounds.  Exam reveals no gallop and no friction rub.   No murmur heard. tachycardic  Pulmonary/Chest: Effort normal and breath sounds normal. No respiratory distress.  Abdominal: Soft. She exhibits no distension. There is tenderness.  Diffuse poorly localized abdominal tenderness. No rebound or guaading. No  distension.   Musculoskeletal: She exhibits no edema and no tenderness.  Neurological:  Drowsy but opens eyes to voice. Answers basic questions. Confused and keeps asking same questions repetitively. No facial droop. Moving all extremities.   Skin: Skin is warm and dry.  Psychiatric: Thought content normal.    ED Course  Procedures (including critical care time)  CRITICAL CARE Performed by: Raeford Razor  Total critical care time: 80 minutes  Critical care time was exclusive of separately billable procedures and treating other patients. Critical care was necessary to treat or prevent imminent or life-threatening deterioration. Critical care was time spent personally by me on the following activities: development of treatment plan with patient and/or surrogate as well as nursing, discussions with consultants, evaluation of patient's response to treatment, examination of patient, obtaining history from patient or surrogate, ordering and performing treatments and interventions, ordering and review of laboratory studies, ordering and review of radiographic studies, pulse oximetry and re-evaluation of patient's condition.   Angiocath insertion Performed by: Raeford Razor  Consent: Verbal consent obtained. Risks and benefits: risks, benefits and alternatives were discussed Time out: Immediately prior to procedure a "time out" was called to verify the correct patient, procedure, equipment, support staff and site/side marked as required.  Preparation: Patient was prepped and draped in the usual sterile fashion.  Vein Location: L antecub  Ultrasound Guided  Gauge: 20  Normal blood return and flush without difficulty Patient tolerance: Patient tolerated the procedure well with no immediate complications.    Labs Review Labs Reviewed  CBC WITH DIFFERENTIAL - Abnormal; Notable for the following:    WBC 12.2 (*)    RBC 3.51 (*)    Hemoglobin 10.1 (*)    HCT 32.6 (*)    RDW 15.7 (*)     Neutrophils Relative % 84 (*)    Neutro Abs 10.1 (*)    Lymphocytes Relative 8 (*)    All other components within normal limits  BASIC METABOLIC PANEL - Abnormal; Notable for the following:    CO2 17 (*)    Glucose, Bld 172 (*)    GFR calc non Af Amer 50 (*)    GFR calc Af Amer 58 (*)    All other components within normal limits  LACTIC ACID, PLASMA - Abnormal; Notable for the following:    Lactic Acid, Venous 8.1 (*)    All other components within normal limits  CULTURE, BLOOD (ROUTINE X 2)  CULTURE, BLOOD (ROUTINE X 2)  TROPONIN I  MAGNESIUM  URINALYSIS, ROUTINE W REFLEX MICROSCOPIC    Imaging Review No results found.  Ct Abdomen Pelvis Wo Contrast  06/15/2013   CLINICAL DATA:  sepsis. abdominal tenderness. L abdominal mass?  EXAM: CT ABDOMEN AND PELVIS WITHOUT CONTRAST  TECHNIQUE: Multidetector CT imaging of the abdomen and pelvis was performed following the standard  protocol without intravenous contrast.  COMPARISON:  None.  FINDINGS: Visualized portions of the mediastinum demonstrates high-density crescent shaped focus along the anterior lateral aspect of the ascending aorta. Differential considerations are a dissection versus an intramural hematoma. A high density pericardial effusion is also appreciated. Atherosclerotic calcifications are appreciated within the coronary vessels.  Areas of increased density appreciated within the lung bases. A small right pleural effusion and trace left pleural effusion is appreciated. Calcified granulomata within the the left lung base.  A 5 x 5 cm low attenuating mass appreciated within the dome of the live. This finding may represent a cyst though more ominous etiologies cannot be excluded. A smaller subcentimeter area is appreciated along the anterior periphery of the left lobe of the liver.  Noncontrast evaluation spleen, adrenals, pancreas, kidneys is unremarkable. Calcified gallstones are appreciated within the gallbladder. Gallbladder fossa  is otherwise unremarkable.  The pelvis is degraded by metallic artifact due to a right hip prostheses. Visualized portions of the bowel negative. The region concerning for a mass within the abdominal wall on the left is secondary to extension of the patient's pannus along the lateral abdominal wall.  Otherwise within the limitations of a noncontrast CT there is no evidence of abdominal or pelvic masses, free fluid, nor loculated fluid collections.  A 2 cm lytic focus is appreciated within the left lateral aspect of L3. This is an indeterminate finding and clinical correlation recommended. Multilevel degenerative changes are appreciated in the spine.  IMPRESSION: 1. Findings within the ascending aorta concerning for a type a dissection versus an intramural hematoma. Further evaluation with dedicated CTA of the chest is recommended. Critical Value/emergent results were called by telephone at the time of interpretation on 06/15/2013 at 8:20 PM to Dr. Raeford Razor , who verbally acknowledged these results. A high density pericardial effusion is also appreciated concerning for hemorrhage into the pericardial space. 2. Atelectasis versus infiltrate within the lung bases as well as French Ana fusion 3. 5.5 cm low attenuating mass within the dome of the liver. Further evaluation of this finding with contrasted CT of the abdomen and pelvis is recommended. 4. Lytic lesion and L3 indeterminate clinical correlation recommended   Electronically Signed   By: Salome Holmes M.D.   On: 06/15/2013 20:26   Dg Chest Portable 1 View  06/15/2013   CLINICAL DATA:  Weakness  EXAM: PORTABLE CHEST - 1 VIEW  COMPARISON:  PA and lateral chest x-ray of May 26, 2012.  FINDINGS: The lungs are hypoinflated. There is no focal infiltrate. The cardiopericardial silhouette remains enlarged. The pulmonary vascularity is not engorged. There is no pleural effusion or pneumothorax. There is degenerative change of both shoulders.  IMPRESSION: There is  enlargement of cardiac silhouette without pulmonary vascular congestion. There is no evidence of pneumonia nor other acute cardiopulmonary abnormality.   Electronically Signed   By: David  [place]   On: 06/15/2013 20:27    EKG Interpretation None      EKG:  Rhythm: With rapid ventricular response Rate: 138 QRS: 83 ms QTc.: 492 ms ST segments: NS ST changes   MDM   Final diagnoses:  Severe sepsis  Type 1 dissection of ascending aorta  UTI (urinary tract infection)    88yF brought in for decreased mental status. Initial impression was that patient had sepsis with fever and hypotension. Abdomen is tender on exam a CT was ordered. Expected renal function to be poor and CT /wo contrast was ordered. No acute intra-abdominal abnormality, but the CT  showed secondary evidence of likely type A aortic dissection. Hypotension responsive IV fluids. Unfortunately prognosis extremely poor. Discussed with CT surgery. Patient is nonsurgical candidate given numerous factors including age, dementia, concurrent sepsis, etc. Initially discussed with critical care, but with now DNR/comfort measures, admitted to medicine.   Raeford RazorStephen Davonne Jarnigan, MD 06/24/13 2209

## 2013-06-15 NOTE — ED Notes (Addendum)
phelbotomy at bedside- attempted to draw blood.

## 2013-06-15 NOTE — ED Notes (Addendum)
Pt placed in modified trendelenburg. Pt alert and talking to RN and tech. States that she feels fine.

## 2013-06-15 NOTE — ED Notes (Addendum)
Pt transported to CT ?

## 2013-06-15 NOTE — ED Notes (Signed)
MD spoke with family about wishes for care- they do not want to pursue surgery at this time.

## 2013-06-15 NOTE — ED Notes (Addendum)
OK by MD to start IV antibiotics despite not having blood cultures or urine.

## 2013-06-15 NOTE — H&P (Signed)
[facility] History and Physical  Jillian BreechMeta Bovey ZOX:[id number] DOB: 12/02/1924 DOA: 06/15/2013  Referring physician: ER physician. PCP: Pcp Not In System  Chief Complaint: Weakness and no doing well.  History obtained from patient's family and the ER physician as patient is demented.  HPI: Jillian Wilkerson is a 80 y.o. female with history of dementia, hypothyroidism was brought to the ER after patient was found to be weak and not doing well. In the ER patient was found to be hypotensive and on exam patient had mild abdominal tenderness. CT abdomen and pelvis was done which shows features concerning for type I aortic dissection. ED physician Dr. Juleen ChinaKohut discussed with family and on-call cardiothoracic surgeon. At this time family is requesting only comfort care and to continue with antibiotics for possible sepsis secondary to possible UTI. Patient is demented and does not contribute to history. On exam patient is not in distress.   Review of Systems: As presented in the history of presenting illness, rest negative.  Past Medical History  Diagnosis Date  . Dementia   . Alzheimer disease   . Osteoporosis   . Hearing loss   . Urinary incontinence   . Asthma   . Alzheimer's dementia   . Chronic back pain   . Urinary incontinence   . Osteoporosis   . Gait instability   . Depression   . Anxiety   . Hyperthyroidism   . Hearing loss   . Prolapsing mitral leaflet syndrome    History reviewed. No pertinent past surgical history. Social History:  reports that she has quit smoking. She has never used smokeless tobacco. She reports that she does not drink alcohol or use illicit drugs. Where does patient live nursing home. Can patient participate in ADLs? No.  Allergies  Allergen Reactions  . Codeine Other (See Comments)    Labored breathing  . Penicillins Rash    Family History:  Family History  Problem Relation Age of Onset  . Hypertension Father       Prior to Admission  medications   Medication Sig Start Date End Date Taking? Authorizing Provider  alendronate (FOSAMAX) 70 MG tablet Take 70 mg by mouth every 7 (seven) days. Take with a full glass of water on an empty stomach.   Yes Historical Provider, MD  aspirin EC 81 MG tablet Take 81 mg by mouth daily.   Yes Historical Provider, MD  docusate sodium (COLACE) 100 MG capsule Take 100 mg by mouth 2 (two) times daily.   Yes Historical Provider, MD  donepezil (ARICEPT) 10 MG tablet Take 10 mg by mouth at bedtime.   Yes Historical Provider, MD  escitalopram (LEXAPRO) 20 MG tablet Take 20 mg by mouth daily.   Yes Historical Provider, MD  furosemide (LASIX) 20 MG tablet Take 20 mg by mouth 2 (two) times daily.   Yes Historical Provider, MD  GLUCOSAMINE CHONDROITIN COMPLX PO Take 1 tablet by mouth 3 (three) times daily.   Yes Historical Provider, MD  levothyroxine (SYNTHROID, LEVOTHROID) 75 MCG tablet Take 75 mcg by mouth daily before breakfast.   Yes Historical Provider, MD  Memantine HCl ER (NAMENDA XR) 28 MG CP24 Take 28 mg by mouth daily.   Yes Historical Provider, MD  metoprolol tartrate (LOPRESSOR) 25 MG tablet Take 12.5 mg by mouth 2 (two) times daily.    Yes Historical Provider, MD  mirtazapine (REMERON) 15 MG tablet Take 15 mg by mouth at bedtime.   Yes Historical Provider, MD  Vitamin D, Ergocalciferol, (DRISDOL)  50000 UNITS CAPS capsule Take 50,000 Units by mouth every 7 (seven) days.   Yes Historical Provider, MD    Physical Exam: Filed Vitals:   06/15/13 2216 06/15/13 2223 06/15/13 2231 06/15/13 2245  BP: 86/59  99/81 82/40  Pulse: 87  88 84  Temp:  99.2 F (37.3 C)    TempSrc:  Rectal    Resp: 21  25 26   SpO2: 100%  100% 96%     General:  Well-developed and moderately nourished.  Eyes: Anicteric no pallor.  ENT: No discharge from the ears eyes nose mouth.  Neck: No mass felt.  Cardiovascular: S1-S2 heard.  Respiratory: No rhonchi or crepitation.  Abdomen: Soft nontender bowel sounds  present. No guarding or rigidity.  Skin: No rash.  Musculoskeletal: No edema.  Psychiatric: Patient is demented.  Neurologic: Patient is demented.  Labs on Admission:  Basic Metabolic Panel:  Recent Labs Lab 06/15/13 1836  NA 143  K 3.7  CL 100  CO2 17*  GLUCOSE 172*  BUN 19  CREATININE 0.99  CALCIUM 8.4  MG 1.9   Liver Function Tests: No results found for this basename: AST, ALT, ALKPHOS, BILITOT, PROT, ALBUMIN,  in the last 168 hours No results found for this basename: LIPASE, AMYLASE,  in the last 168 hours No results found for this basename: AMMONIA,  in the last 168 hours CBC:  Recent Labs Lab 06/15/13 1836  WBC 12.2*  NEUTROABS 10.1*  HGB 10.1*  HCT 32.6*  MCV 92.9  PLT 189   Cardiac Enzymes:  Recent Labs Lab 06/15/13 1836  TROPONINI <0.30    BNP (last 3 results) No results found for this basename: PROBNP,  in the last 8760 hours CBG: No results found for this basename: GLUCAP,  in the last 168 hours  Radiological Exams on Admission: Ct Abdomen Pelvis Wo Contrast  06/15/2013   CLINICAL DATA:  sepsis. abdominal tenderness. L abdominal mass?  EXAM: CT ABDOMEN AND PELVIS WITHOUT CONTRAST  TECHNIQUE: Multidetector CT imaging of the abdomen and pelvis was performed following the standard protocol without intravenous contrast.  COMPARISON:  None.  FINDINGS: Visualized portions of the mediastinum demonstrates high-density crescent shaped focus along the anterior lateral aspect of the ascending aorta. Differential considerations are a dissection versus an intramural hematoma. A high density pericardial effusion is also appreciated. Atherosclerotic calcifications are appreciated within the coronary vessels.  Areas of increased density appreciated within the lung bases. A small right pleural effusion and trace left pleural effusion is appreciated. Calcified granulomata within the the left lung base.  A 5 x 5 cm low attenuating mass appreciated within the dome of  the live. This finding may represent a cyst though more ominous etiologies cannot be excluded. A smaller subcentimeter area is appreciated along the anterior periphery of the left lobe of the liver.  Noncontrast evaluation spleen, adrenals, pancreas, kidneys is unremarkable. Calcified gallstones are appreciated within the gallbladder. Gallbladder fossa is otherwise unremarkable.  The pelvis is degraded by metallic artifact due to a right hip prostheses. Visualized portions of the bowel negative. The region concerning for a mass within the abdominal wall on the left is secondary to extension of the patient's pannus along the lateral abdominal wall.  Otherwise within the limitations of a noncontrast CT there is no evidence of abdominal or pelvic masses, free fluid, nor loculated fluid collections.  A 2 cm lytic focus is appreciated within the left lateral aspect of L3. This is an indeterminate finding and clinical correlation recommended.  Multilevel degenerative changes are appreciated in the spine.  IMPRESSION: 1. Findings within the ascending aorta concerning for a type a dissection versus an intramural hematoma. Further evaluation with dedicated CTA of the chest is recommended. Critical Value/emergent results were called by telephone at the time of interpretation on 06/15/2013 at 8:20 PM to Dr. Raeford Razor , who verbally acknowledged these results. A high density pericardial effusion is also appreciated concerning for hemorrhage into the pericardial space. 2. Atelectasis versus infiltrate within the lung bases as well as French Ana fusion 3. 5.5 cm low attenuating mass within the dome of the liver. Further evaluation of this finding with contrasted CT of the abdomen and pelvis is recommended. 4. Lytic lesion and L3 indeterminate clinical correlation recommended   Electronically Signed   By: Salome Holmes M.D.   On: 06/15/2013 20:26   Dg Chest Portable 1 View  06/15/2013   CLINICAL DATA:  Weakness  EXAM: PORTABLE  CHEST - 1 VIEW  COMPARISON:  PA and lateral chest x-ray of May 26, 2012.  FINDINGS: The lungs are hypoinflated. There is no focal infiltrate. The cardiopericardial silhouette remains enlarged. The pulmonary vascularity is not engorged. There is no pleural effusion or pneumothorax. There is degenerative change of both shoulders.  IMPRESSION: There is enlargement of cardiac silhouette without pulmonary vascular congestion. There is no evidence of pneumonia nor other acute cardiopulmonary abnormality.   Electronically Signed   By: David  [place]   On: 06/15/2013 20:27     Assessment/Plan Principal Problem:   Sepsis Active Problems:   Dementia   UTI (lower urinary tract infection)   Aortic dissection   Hypothyroidism   1. Sepsis probably from UTI. 2. Type I aortic dissection. 3. Dementia. 4. Anemia. 5. Hypothyroidism.  Plan - at this time family has requested only comfort measures but to continue with IV fluids and antibiotics. No further labs or aggressive measures. I have continued patient on vancomycin and cefepime for sepsis which is probably from UTI. Patient does not complain of any pain. I have also ordered morphine when necessary for pain. I have consulted palliative care.    Code Status: DO NOT RESUSCITATE.  Family Communication: Patient's daughter and son at the bedside.  Disposition Plan: Admit to inpatient.    Eduard Clos [facility] Pager [phone]  If 7PM-7AM, please contact night-coverage [email or web address] Password [facility] 06/15/2013, 10:59 PM

## 2013-06-16 ENCOUNTER — Encounter (HOSPITAL_COMMUNITY): Payer: Self-pay | Admitting: General Practice

## 2013-06-16 DIAGNOSIS — Z515 Encounter for palliative care: Secondary | ICD-10-CM

## 2013-06-16 DIAGNOSIS — R0989 Other specified symptoms and signs involving the circulatory and respiratory systems: Secondary | ICD-10-CM

## 2013-06-16 DIAGNOSIS — R0609 Other forms of dyspnea: Secondary | ICD-10-CM

## 2013-06-16 DIAGNOSIS — R52 Pain, unspecified: Secondary | ICD-10-CM

## 2013-06-16 DIAGNOSIS — R0600 Dyspnea, unspecified: Secondary | ICD-10-CM

## 2013-06-16 LAB — MRSA PCR SCREENING: MRSA BY PCR: POSITIVE — AB

## 2013-06-16 MED ORDER — FUROSEMIDE 20 MG PO TABS
20.0000 mg | ORAL_TABLET | Freq: Two times a day (BID) | ORAL | Status: DC | PRN
  Filled 2013-06-16: qty 1

## 2013-06-16 MED ORDER — MORPHINE SULFATE 2 MG/ML IJ SOLN
2.0000 mg | INTRAMUSCULAR | Status: DC | PRN
  Administered 2013-06-19: 2 mg via INTRAVENOUS
  Filled 2013-06-16 (×2): qty 1

## 2013-06-16 MED ORDER — LORAZEPAM 2 MG/ML IJ SOLN
1.0000 mg | INTRAMUSCULAR | Status: DC | PRN
  Administered 2013-06-18 – 2013-06-19 (×3): 1 mg via INTRAVENOUS
  Filled 2013-06-16 (×3): qty 1

## 2013-06-16 MED ORDER — DEXTROSE 5 % IV SOLN
1.0000 g | INTRAVENOUS | Status: DC
  Administered 2013-06-16 – 2013-06-19 (×4): 1 g via INTRAVENOUS
  Filled 2013-06-16 (×5): qty 10

## 2013-06-16 NOTE — Progress Notes (Signed)
[facility] PROGRESS NOTE  Jillian BreechMeta Wilkerson VHQ:[id number] DOB: 04/25/1924 DOA: 06/15/2013 PCP: Pcp Not In System  Assessment/Plan: 1. Sepsis from UTI and type 1 aortic dissection/ advanced dementia: at this time family has requested only comfort measures. No further labs or aggressive measures. Consulted palliative care for further recommendations.     Code Status: DNR Family Communication: DISCUSSED WITH FAMILY AT BEDSIDE Disposition Plan: pending.    Consultants:  Palliative care  Procedures:  none  Antibiotics:  none  HPI/Subjective: Comfortable.   Objective: Filed Vitals:   06/16/13 0922  BP: 123/85  Pulse: 77  Temp: 97.2 F (36.2 C)  Resp: 20    Intake/Output Summary (Last 24 hours) at 06/16/13 1612 Last data filed at 06/16/13 0534  Gross per 24 hour  Intake      0 ml  Output      2 ml  Net     -2 ml   Filed Weights   06/16/13 [id number]  Weight: 86.9 kg (191 lb 9.3 oz)    Exam:   General:  Sleeping comfortably  Cardiovascular: s1s2  Respiratory: shallow breathing   Abdomen: soft, non distended.     Data Reviewed: Basic Metabolic Panel:  Recent Labs Lab 06/15/13 1836  NA 143  K 3.7  CL 100  CO2 17*  GLUCOSE 172*  BUN 19  CREATININE 0.99  CALCIUM 8.4  MG 1.9   Liver Function Tests: No results found for this basename: AST, ALT, ALKPHOS, BILITOT, PROT, ALBUMIN,  in the last 168 hours No results found for this basename: LIPASE, AMYLASE,  in the last 168 hours No results found for this basename: AMMONIA,  in the last 168 hours CBC:  Recent Labs Lab 06/15/13 1836  WBC 12.2*  NEUTROABS 10.1*  HGB 10.1*  HCT 32.6*  MCV 92.9  PLT 189   Cardiac Enzymes:  Recent Labs Lab 06/15/13 1836  TROPONINI <0.30   BNP (last 3 results) No results found for this basename: PROBNP,  in the last 8760 hours CBG: No results found for this basename: GLUCAP,  in the last 168 hours  Recent Results (from the past 240 hour(s))  MRSA PCR  SCREENING     Status: Abnormal   Collection Time    06/15/13 11:59 PM      Result Value Ref Range Status   MRSA by PCR POSITIVE (*) NEGATIVE Final   Comment:            The GeneXpert MRSA Assay (FDA     approved for NASAL specimens     only), is one component of a     comprehensive MRSA colonization     surveillance program. It is not     intended to diagnose MRSA     infection nor to guide or     monitor treatment for     MRSA infections.     RESULT CALLED TO, READ BACK BY AND VERIFIED WITH:     [name] RN [phone] GREEN R     Studies: Ct Abdomen Pelvis Wo Contrast  06/15/2013   CLINICAL DATA:  sepsis. abdominal tenderness. L abdominal mass?  EXAM: CT ABDOMEN AND PELVIS WITHOUT CONTRAST  TECHNIQUE: Multidetector CT imaging of the abdomen and pelvis was performed following the standard protocol without intravenous contrast.  COMPARISON:  None.  FINDINGS: Visualized portions of the mediastinum demonstrates high-density crescent shaped focus along the anterior lateral aspect of the ascending aorta. Differential considerations are a dissection versus an intramural hematoma. A  high density pericardial effusion is also appreciated. Atherosclerotic calcifications are appreciated within the coronary vessels.  Areas of increased density appreciated within the lung bases. A small right pleural effusion and trace left pleural effusion is appreciated. Calcified granulomata within the the left lung base.  A 5 x 5 cm low attenuating mass appreciated within the dome of the live. This finding may represent a cyst though more ominous etiologies cannot be excluded. A smaller subcentimeter area is appreciated along the anterior periphery of the left lobe of the liver.  Noncontrast evaluation spleen, adrenals, pancreas, kidneys is unremarkable. Calcified gallstones are appreciated within the gallbladder. Gallbladder fossa is otherwise unremarkable.  The pelvis is degraded by metallic artifact due to a right  hip prostheses. Visualized portions of the bowel negative. The region concerning for a mass within the abdominal wall on the left is secondary to extension of the patient's pannus along the lateral abdominal wall.  Otherwise within the limitations of a noncontrast CT there is no evidence of abdominal or pelvic masses, free fluid, nor loculated fluid collections.  A 2 cm lytic focus is appreciated within the left lateral aspect of L3. This is an indeterminate finding and clinical correlation recommended. Multilevel degenerative changes are appreciated in the spine.  IMPRESSION: 1. Findings within the ascending aorta concerning for a type a dissection versus an intramural hematoma. Further evaluation with dedicated CTA of the chest is recommended. Critical Value/emergent results were called by telephone at the time of interpretation on 06/15/2013 at 8:20 PM to Dr. Raeford RazorSTEPHEN KOHUT , who verbally acknowledged these results. A high density pericardial effusion is also appreciated concerning for hemorrhage into the pericardial space. 2. Atelectasis versus infiltrate within the lung bases as well as French Anaracy fusion 3. 5.5 cm low attenuating mass within the dome of the liver. Further evaluation of this finding with contrasted CT of the abdomen and pelvis is recommended. 4. Lytic lesion and L3 indeterminate clinical correlation recommended   Electronically Signed   By: Salome HolmesHector  Cooper M.D.   On: 06/15/2013 20:26   Dg Chest Portable 1 View  06/15/2013   CLINICAL DATA:  Weakness  EXAM: PORTABLE CHEST - 1 VIEW  COMPARISON:  PA and lateral chest x-ray of May 26, 2012.  FINDINGS: The lungs are hypoinflated. There is no focal infiltrate. The cardiopericardial silhouette remains enlarged. The pulmonary vascularity is not engorged. There is no pleural effusion or pneumothorax. There is degenerative change of both shoulders.  IMPRESSION: There is enlargement of cardiac silhouette without pulmonary vascular congestion. There is no  evidence of pneumonia nor other acute cardiopulmonary abnormality.   Electronically Signed   By: David  [place]   On: 06/15/2013 20:27    Scheduled Meds:  Continuous Infusions: . sodium chloride 100 mL/hr at 06/16/13 0504    Principal Problem:   Sepsis Active Problems:   Dementia   UTI (lower urinary tract infection)   Aortic dissection   Hypothyroidism    Time spent: 35 min   Kathlen ModyVijaya Jazlynn Nemetz  [facility] Pager [phone]. If 7PM-7AM, please contact night-coverage at [email or web address], password [email or web address] 06/16/2013, 4:12 PM  LOS: 1 day

## 2013-06-16 NOTE — Progress Notes (Signed)
Pt arrived to unit alert and oriented x1. Oriented to room, unit, and staff. Stage 2 noted to left buttock on admission. Family at bedside. Bed in lowest position and call bell is within reach. Will continue to monitor.

## 2013-06-16 NOTE — Progress Notes (Signed)
Utilization review completed. Kendyn Zaman, RN, BSN. 

## 2013-06-16 NOTE — Consult Note (Signed)
Patient ZO:XWRU:Jillian Wilkerson      DOB: 05/29/1924      EAV:[id number]     [facility]    Consult Requested by: Dr Harlow AsaKakrakady     PCP: Pcp Not In System Reason for Consultation: Clarification of GOC and options    Phone Number:None  Assessment of patients Current state: Jillian Wilkerson is a 80 y.o. female with history of dementia, hypothyroidism was brought to the ER after patient was found to be weak and not doing well. In the ER patient was found to be hypotensive and on exam patient had mild abdominal tenderness. CT abdomen and pelvis was done which shows features concerning for type I aortic dissection. Reported continued physical, functional and cognitive decline over the past year.  Family faced with advanced directive questions and anticipatory care needs.  This NP Lorinda CreedMary Larach reviewed medical records, received report from team, assessed the patient and then meet at the patient's bedside along with his daughter Malva CoganMarty Moore, son Susy FrizzleMatt  to discuss diagnosis prognosis, GOC, EOL wishes disposition and options.  A detailed discussion was had today regarding advanced directives.  Concepts specific to code status, artifical feeding and hydration, continued IV antibiotics and rehospitalization was had.  The difference between a aggressive medical intervention path  and a palliative comfort care path for this patient at this time was had.  Values and goals of care important to patient and family were attempted to be elicited.  Concept of Hospice and Palliative Care were discussed  Natural trajectory and expectations at EOL were discussed.  Questions and concerns addressed.  Hard Choices booklet left for review. Family encouraged to call with questions or concerns.  PMT will continue to support holistically.   Goals of Care: 1.  Code Status: DNR/DNI-comfort is main focus of care  Presently patient is resting comfortable with no symptoms of pain or  dyspnea.  Medications in place for possible symptoms related to further dissection.    2. Scope of Treatment: 1. Vital Signs:daily  2. Respiratory/Oxygen:for comfort only 3. Nutritional Support/Tube Feeds:no artifical feeding now or in the future 4. Antibiotics: none 5. Blood Products:none 6. NWG:NFAOVF:only KVO for medications 7. Review of Medications to be discontinued:minimize for comfort 8. Labs: none 9. Telemetry:none 10. Consults:none   3. Disposition:  Dependant on outcomes.  Monitor over the next 24-48 hrs. If patient deteriorates an in-patient  hospice vs GIP would make sense.  If she stabalizes we will need to establish if her AL can take her back with hospice services in place.   4. Symptom Management:   1. Anxiety/Agitation:  Ativan 1 mg every 4 hrs prn  2. Pain/Dyspnea: Morphine 2 mg every 1 hr prn  5. Psychosocial:  Emotional support to son and daughter at bedside.   All hope for comfort, quality and dignity at this time.    Patient Documents Completed or Given: Document Given Completed  Advanced Directives Pkt    MOST  yes  DNR    Gone from My Sight    Hard Choices yes     Brief ZHY:QMVHHPI:Jillian Wilkerson is a 80 y.o. female with history of dementia, hypothyroidism was brought to the ER after patient was found to be weak and not doing well. In the ER patient was found to be hypotensive and on exam patient had mild abdominal tenderness. CT abdomen and pelvis was done which shows features concerning for type I aortic dissection.   ROS: unable to illicit  from patient    PMH:  Past Medical History  Diagnosis Date  . Dementia   . Alzheimer disease   . Osteoporosis   . Hearing loss   . Urinary incontinence   . Asthma   . Alzheimer's dementia   . Chronic back pain   . Urinary incontinence   . Osteoporosis   . Gait instability   . Depression   . Anxiety   . Hyperthyroidism   . Hearing loss   . Prolapsing mitral leaflet syndrome   . Hypothyroidism     Pt daughter  stated hypothyroidism not hyper     PSH: Past Surgical History  Procedure Laterality Date  . Appendectomy    . Hysterectomy  1985?   I have reviewed the FH and SH and  If appropriate update it with new information. Allergies  Allergen Reactions  . Codeine Other (See Comments)    Labored breathing  . Penicillins Rash   Scheduled Meds: . ceFEPime (MAXIPIME) IV  1 g Intravenous Q8H  . docusate sodium  100 mg Oral BID  . donepezil  10 mg Oral QHS  . escitalopram  20 mg Oral Daily  . furosemide  20 mg Oral BID  . levothyroxine  75 mcg Oral QAC breakfast  . Memantine HCl ER  28 mg Oral Daily  . mirtazapine  15 mg Oral QHS  . vancomycin  750 mg Intravenous Q12H   Continuous Infusions: . sodium chloride 100 mL/hr at 06/16/13 0504   PRN Meds:.acetaminophen, acetaminophen, morphine injection, ondansetron (ZOFRAN) IV, ondansetron    BP 123/85  Pulse 77  Temp(Src) 97.2 F (36.2 C) (Oral)  Resp 20  Ht 5\' 5"  (1.651 m)  Wt 86.9 kg (191 lb 9.3 oz)  BMI 31.88 kg/m2  SpO2 97%   PPS:30 % at best   Intake/Output Summary (Last 24 hours) at 06/16/13 1039 Last data filed at 06/16/13 0534  Gross per 24 hour  Intake      0 ml  Output      2 ml  Net     -2 ml    Physical Exam:  General: lethargic,  resting comfortably, NAD HEENT:  Mm, no exudate Chest:   Decreased in bases, CTA CVS: RRR Abdomen:obese, + BS Ext: without edema Neuro:  Moves all four extremities, oriented to person only  Labs: CBC    Component Value Date/Time   WBC 12.2* 06/15/2013 1836   RBC 3.51* 06/15/2013 1836   HGB 10.1* 06/15/2013 1836   HCT 32.6* 06/15/2013 1836   PLT 189 06/15/2013 1836   MCV 92.9 06/15/2013 1836   MCH 28.8 06/15/2013 1836   MCHC 31.0 06/15/2013 1836   RDW 15.7* 06/15/2013 1836   LYMPHSABS 1.0 06/15/2013 1836   MONOABS 1.0 06/15/2013 1836   EOSABS 0.0 06/15/2013 1836   BASOSABS 0.1 06/15/2013 1836    BMET    Component Value Date/Time   NA 143 06/15/2013 1836   K 3.7 06/15/2013  1836   CL 100 06/15/2013 1836   CO2 17* 06/15/2013 1836   GLUCOSE 172* 06/15/2013 1836   BUN 19 06/15/2013 1836   CREATININE 0.99 06/15/2013 1836   CALCIUM 8.4 06/15/2013 1836   GFRNONAA 50* 06/15/2013 1836   GFRAA 58* 06/15/2013 1836    CMP     Component Value Date/Time   NA 143 06/15/2013 1836   K 3.7 06/15/2013 1836   CL 100 06/15/2013 1836   CO2 17* 06/15/2013 1836   GLUCOSE 172* 06/15/2013 1836  BUN 19 06/15/2013 1836   CREATININE 0.99 06/15/2013 1836   CALCIUM 8.4 06/15/2013 1836   PROT 6.5 03/03/2013 0520   ALBUMIN 2.7* 03/03/2013 0520   AST 9 03/03/2013 0520   ALT 6 03/03/2013 0520   ALKPHOS 55 03/03/2013 0520   BILITOT 0.4 03/03/2013 0520   GFRNONAA 50* 06/15/2013 1836   GFRAA 58* 06/15/2013 1836    IMPRESSION:  1. Findings within the ascending aorta concerning for a type a  dissection versus an intramural hematoma. Further evaluation with  dedicated CTA of the chest is recommended. Critical Value/emergent  results were called by telephone at the time of interpretation on  06/15/2013 at 8:20 PM to Dr. Raeford RazorSTEPHEN KOHUT , who verbally  acknowledged these results. A high density pericardial effusion is  also appreciated concerning for hemorrhage into the pericardial  space.  2. Atelectasis versus infiltrate within the lung bases as well as  French Anaracy fusion  3. 5.5 cm low attenuating mass within the dome of the liver. Further  evaluation of this finding with contrasted CT of the abdomen and  pelvis is recommended.  4. Lytic lesion and L3 indeterminate clinical correlation  recommended   Time In Time Out Total Time Spent with Patient Total Overall Time  0830 1000 80 min 90 min    Greater than 50%  of this time was spent counseling and coordinating care related to the above assessment and plan.   Lorinda CreedMary Larach NP  Palliative Medicine Team Team Phone # [phone] Pager [phone]  Discussed with Dr Blake DivineAkula

## 2013-06-17 LAB — CULTURE, BLOOD (ROUTINE X 2)

## 2013-06-17 MED ORDER — SODIUM CHLORIDE 0.9 % IV SOLN: INTRAVENOUS | Status: DC

## 2013-06-17 MED ORDER — SODIUM CHLORIDE 0.9 % IV SOLN
INTRAVENOUS | Status: DC
  Administered 2013-06-17: 22:00:00 via INTRAVENOUS

## 2013-06-17 MED ORDER — SODIUM CHLORIDE 0.9 % IV BOLUS (SEPSIS)
500.0000 mL | Freq: Once | INTRAVENOUS | Status: AC
  Administered 2013-06-17: 500 mL via INTRAVENOUS

## 2013-06-17 NOTE — Consult Note (Signed)
I have reviewed this case with our NP and agree with the Assessment and Plan as stated.  Shaunika Italiano L. Carter Kassel, MD MBA The Palliative Medicine Team at [place] Team Phone: [phone] Pager: [phone]   

## 2013-06-17 NOTE — Progress Notes (Signed)
[facility] PROGRESS NOTE  Jillian BreechMeta Wilkerson ZOX:[id number] DOB: 11/12/1924 DOA: 06/15/2013 PCP: Pcp Not In System  Assessment/Plan: 1. Sepsis from UTI and type 1 aortic dissection/ advanced dementia: at this time family has requested only comfort measures. No further labs or aggressive measures. Consulted palliative care for further recommendations.  PLAN to discharge her to [facility] with hospice services to follow. Patient convened today and wanted fluids and antibiotics till she gets discharged. We will honor their wishes and started her back on rocephin and IV fludis.  DVT prophylaxis.      Code Status: DNR Family Communication: DISCUSSED WITH FAMILY AT BEDSIDE Disposition Plan: pending.    Consultants:  Palliative care  Procedures:  Ct abdomen and pelvis.   Antibiotics:  none  HPI/Subjective: Comfortable.   Objective: Filed Vitals:   06/17/13 1427  BP: 85/64  Pulse: 135  Temp: 98.1 F (36.7 C)  Resp: 20    Intake/Output Summary (Last 24 hours) at 06/17/13 1431 Last data filed at 06/17/13 0936  Gross per 24 hour  Intake     60 ml  Output      5 ml  Net     55 ml   Filed Weights   06/16/13 0922  Weight: 86.9 kg (191 lb 9.3 oz)    Exam:   General:  Sleeping comfortably  Cardiovascular: s1s2  Respiratory: shallow breathing   Abdomen: soft, non distended.     Data Reviewed: Basic Metabolic Panel:  Recent Labs Lab 06/15/13 1836  NA 143  K 3.7  CL 100  CO2 17*  GLUCOSE 172*  BUN 19  CREATININE 0.99  CALCIUM 8.4  MG 1.9   Liver Function Tests: No results found for this basename: AST, ALT, ALKPHOS, BILITOT, PROT, ALBUMIN,  in the last 168 hours No results found for this basename: LIPASE, AMYLASE,  in the last 168 hours No results found for this basename: AMMONIA,  in the last 168 hours CBC:  Recent Labs Lab 06/15/13 1836  WBC 12.2*  NEUTROABS 10.1*  HGB 10.1*  HCT 32.6*  MCV 92.9  PLT 189   Cardiac  Enzymes:  Recent Labs Lab 06/15/13 1836  TROPONINI <0.30   BNP (last 3 results) No results found for this basename: PROBNP,  in the last 8760 hours CBG: No results found for this basename: GLUCAP,  in the last 168 hours  Recent Results (from the past 240 hour(s))  CULTURE, BLOOD (ROUTINE X 2)     Status: None   Collection Time    06/15/13  7:36 PM      Result Value Ref Range Status   Specimen Description BLOOD RIGHT ARM UPPER   Final   Special Requests BOTTLES DRAWN AEROBIC AND ANAEROBIC 1CC   Final   Culture  Setup Time     Final   Value: 06/16/2013 01:21     Performed at [facility]   Culture     Final   Value: STAPHYLOCOCCUS SPECIES (COAGULASE NEGATIVE)     Note: THE SIGNIFICANCE OF ISOLATING THIS ORGANISM FROM A SINGLE SET OF BLOOD CULTURES WHEN MULTIPLE SETS ARE DRAWN IS UNCERTAIN. PLEASE NOTIFY THE MICROBIOLOGY DEPARTMENT WITHIN ONE WEEK IF SPECIATION AND SENSITIVITIES ARE REQUIRED.     17 Note: Gram Stain Report Called to,Read Back By and Verified With: Parke PoissonNIKKI SAUCETTE RN 4 15 750P [name]     Performed at [facility]   Report Status 06/17/2013 FINAL   Final  CULTURE, BLOOD (ROUTINE X 2)  Status: None   Collection Time    06/15/13  8:07 PM      Result Value Ref Range Status   Specimen Description BLOOD RIGHT ARM   Final   Special Requests BOTTLES DRAWN AEROBIC AND ANAEROBIC 10CC   Final   Culture  Setup Time     Final   Value: 06/16/2013 01:21     Performed at [facility]   Culture     Final   Value:        BLOOD CULTURE RECEIVED NO GROWTH TO DATE CULTURE WILL BE HELD FOR 5 DAYS BEFORE ISSUING A FINAL NEGATIVE REPORT     Performed at [facility]   Report Status PENDING   Incomplete  MRSA PCR SCREENING     Status: Abnormal   Collection Time    06/15/13 11:59 PM      Result Value Ref Range Status   MRSA by PCR POSITIVE (*) NEGATIVE Final   Comment:            The GeneXpert MRSA Assay (FDA     approved for NASAL specimens      only), is one component of a     comprehensive MRSA colonization     surveillance program. It is not     intended to diagnose MRSA     infection nor to guide or     monitor treatment for     MRSA infections.     RESULT CALLED TO, READ BACK BY AND VERIFIED WITH:     [name]     Studies: Ct Abdomen Pelvis Wo Contrast  06/15/2013   CLINICAL DATA:  sepsis. abdominal tenderness. L abdominal mass?  EXAM: CT ABDOMEN AND PELVIS WITHOUT CONTRAST  TECHNIQUE: Multidetector CT imaging of the abdomen and pelvis was performed following the standard protocol without intravenous contrast.  COMPARISON:  None.  FINDINGS: Visualized portions of the mediastinum demonstrates high-density crescent shaped focus along the anterior lateral aspect of the ascending aorta. Differential considerations are a dissection versus an intramural hematoma. A high density pericardial effusion is also appreciated. Atherosclerotic calcifications are appreciated within the coronary vessels.  Areas of increased density appreciated within the lung bases. A small right pleural effusion and trace left pleural effusion is appreciated. Calcified granulomata within the the left lung base.  A 5 x 5 cm low attenuating mass appreciated within the dome of the live. This finding may represent a cyst though more ominous etiologies cannot be excluded. A smaller subcentimeter area is appreciated along the anterior periphery of the left lobe of the liver.  Noncontrast evaluation spleen, adrenals, pancreas, kidneys is unremarkable. Calcified gallstones are appreciated within the gallbladder. Gallbladder fossa is otherwise unremarkable.  The pelvis is degraded by metallic artifact due to a right hip prostheses. Visualized portions of the bowel negative. The region concerning for a mass within the abdominal wall on the left is secondary to extension of the patient's pannus along the lateral abdominal wall.  Otherwise within the  limitations of a noncontrast CT there is no evidence of abdominal or pelvic masses, free fluid, nor loculated fluid collections.  A 2 cm lytic focus is appreciated within the left lateral aspect of L3. This is an indeterminate finding and clinical correlation recommended. Multilevel degenerative changes are appreciated in the spine.  IMPRESSION: 1. Findings within the ascending aorta concerning for a type a dissection versus an intramural hematoma. Further evaluation with dedicated CTA of the chest is  recommended. Critical Value/emergent results were called by telephone at the time of interpretation on 06/15/2013 at 8:20 PM to Dr. Raeford Razor , who verbally acknowledged these results. A high density pericardial effusion is also appreciated concerning for hemorrhage into the pericardial space. 2. Atelectasis versus infiltrate within the lung bases as well as French Ana fusion 3. 5.5 cm low attenuating mass within the dome of the liver. Further evaluation of this finding with contrasted CT of the abdomen and pelvis is recommended. 4. Lytic lesion and L3 indeterminate clinical correlation recommended   Electronically Signed   By: Salome Holmes M.D.   On: 06/15/2013 20:26   Dg Chest Portable 1 View  06/15/2013   CLINICAL DATA:  Weakness  EXAM: PORTABLE CHEST - 1 VIEW  COMPARISON:  PA and lateral chest x-ray of May 26, 2012.  FINDINGS: The lungs are hypoinflated. There is no focal infiltrate. The cardiopericardial silhouette remains enlarged. The pulmonary vascularity is not engorged. There is no pleural effusion or pneumothorax. There is degenerative change of both shoulders.  IMPRESSION: There is enlargement of cardiac silhouette without pulmonary vascular congestion. There is no evidence of pneumonia nor other acute cardiopulmonary abnormality.   Electronically Signed   By: David  [place]   On: 06/15/2013 20:27    Scheduled Meds: . cefTRIAXone (ROCEPHIN)  IV  1 g Intravenous Q24H  . sodium chloride  500 mL  Intravenous Once   Continuous Infusions: . sodium chloride 50 mL/hr at 06/17/13 1215  . sodium chloride      Principal Problem:   Sepsis Active Problems:   Dementia   UTI (lower urinary tract infection)   Aortic dissection   Hypothyroidism   Palliative care encounter   Dyspnea   Pain, generalized    Time spent: 35 min   Kathlen Mody  [facility] Pager [phone]. If 7PM-7AM, please contact night-coverage at [email or web address], password [facility] 06/17/2013, 2:31 PM  LOS: 2 days

## 2013-06-18 NOTE — Progress Notes (Signed)
[facility] PROGRESS NOTE  Jillian Wilkerson ZOX:[id number] DOB: 12/05/1924 DOA: 06/15/2013 PCP: Pcp Not In System  Assessment/Plan: 1. Sepsis from UTI and type 1 aortic dissection/ advanced dementia: at this time family has requested only comfort measures. No further labs or aggressive measures. Consulted palliative care for further recommendations.  PLAN to discharge her to [facility] with hospice services to follow. Patient convened today and wanted fluids and antibiotics till she gets discharged. We will honor their wishes and started her back on rocephin and IV fludis.  DVT prophylaxis.      Code Status: DNR Family Communication: DISCUSSED WITH FAMILY AT BEDSIDE Disposition Plan: pending.    Consultants:  Palliative care  Procedures:  Ct abdomen and pelvis.   Antibiotics:  none  HPI/Subjective: Comfortable.   Objective: Filed Vitals:   06/18/13 0603  BP: 126/88  Pulse: 79  Temp: 98.4 F (36.9 C)  Resp: 20    Intake/Output Summary (Last 24 hours) at 06/18/13 0948 Last data filed at 06/18/13 0600  Gross per 24 hour  Intake 1303.75 ml  Output      1 ml  Net 1302.75 ml   Filed Weights   06/16/13 0922  Weight: 86.9 kg (191 lb 9.3 oz)    Exam:   General:  Sleeping comfortably  Cardiovascular: s1s2  Respiratory: shallow breathing   Abdomen: soft, non distended.     Data Reviewed: Basic Metabolic Panel:  Recent Labs Lab 06/15/13 1836  NA 143  K 3.7  CL 100  CO2 17*  GLUCOSE 172*  BUN 19  CREATININE 0.99  CALCIUM 8.4  MG 1.9   Liver Function Tests: No results found for this basename: AST, ALT, ALKPHOS, BILITOT, PROT, ALBUMIN,  in the last 168 hours No results found for this basename: LIPASE, AMYLASE,  in the last 168 hours No results found for this basename: AMMONIA,  in the last 168 hours CBC:  Recent Labs Lab 06/15/13 1836  WBC 12.2*  NEUTROABS 10.1*  HGB 10.1*  HCT 32.6*  MCV 92.9  PLT 189   Cardiac  Enzymes:  Recent Labs Lab 06/15/13 1836  TROPONINI <0.30   BNP (last 3 results) No results found for this basename: PROBNP,  in the last 8760 hours CBG: No results found for this basename: GLUCAP,  in the last 168 hours  Recent Results (from the past 240 hour(s))  CULTURE, BLOOD (ROUTINE X 2)     Status: None   Collection Time    06/15/13  7:36 PM      Result Value Ref Range Status   Specimen Description BLOOD RIGHT ARM UPPER   Final   Special Requests BOTTLES DRAWN AEROBIC AND ANAEROBIC 1CC   Final   Culture  Setup Time     Final   Value: 06/16/2013 01:21     Performed at [facility]   Culture     Final   Value: STAPHYLOCOCCUS SPECIES (COAGULASE NEGATIVE)     Note: THE SIGNIFICANCE OF ISOLATING THIS ORGANISM FROM A SINGLE SET OF BLOOD CULTURES WHEN MULTIPLE SETS ARE DRAWN IS UNCERTAIN. PLEASE NOTIFY THE MICROBIOLOGY DEPARTMENT WITHIN ONE WEEK IF SPECIATION AND SENSITIVITIES ARE REQUIRED.     17 Note: Gram Stain Report Called to,Read Back By and Verified With: Parke PoissonNIKKI SAUCETTE RN 4 15 750P [name]     Performed at [facility]   Report Status 06/17/2013 FINAL   Final  CULTURE, BLOOD (ROUTINE X 2)     Status: None   Collection Time  06/15/13  8:07 PM      Result Value Ref Range Status   Specimen Description BLOOD RIGHT ARM   Final   Special Requests BOTTLES DRAWN AEROBIC AND ANAEROBIC 10CC   Final   Culture  Setup Time     Final   Value: 06/16/2013 01:21     Performed at [facility]   Culture     Final   Value:        BLOOD CULTURE RECEIVED NO GROWTH TO DATE CULTURE WILL BE HELD FOR 5 DAYS BEFORE ISSUING A FINAL NEGATIVE REPORT     Performed at [facility]   Report Status PENDING   Incomplete  MRSA PCR SCREENING     Status: Abnormal   Collection Time    06/15/13 11:59 PM      Result Value Ref Range Status   MRSA by PCR POSITIVE (*) NEGATIVE Final   Comment:            The GeneXpert MRSA Assay (FDA     approved for NASAL specimens      only), is one component of a     comprehensive MRSA colonization     surveillance program. It is not     intended to diagnose MRSA     infection nor to guide or     monitor treatment for     MRSA infections.     RESULT CALLED TO, READ BACK BY AND VERIFIED WITH:     [name] RN [phone] GREEN R     Studies: No results found.  Scheduled Meds: . cefTRIAXone (ROCEPHIN)  IV  1 g Intravenous Q24H   Continuous Infusions: . sodium chloride 75 mL/hr at 06/17/13 2205    Principal Problem:   Sepsis Active Problems:   Dementia   UTI (lower urinary tract infection)   Aortic dissection   Hypothyroidism   Palliative care encounter   Dyspnea   Pain, generalized    Time spent: 35 min   Jillian Wilkerson  [facility] Pager [phone]. If 7PM-7AM, please contact night-coverage at [email or web address], password [email or web address] 06/18/2013, 9:48 AM  LOS: 3 days

## 2013-06-19 MED ORDER — HALOPERIDOL LACTATE 5 MG/ML IJ SOLN: 2.0000 mg | Freq: Four times a day (QID) | INTRAMUSCULAR | Status: DC | PRN

## 2013-06-19 MED ORDER — MORPHINE SULFATE (CONCENTRATE) 10 MG /0.5 ML PO SOLN: 5.0000 mg | ORAL | Status: AC | PRN

## 2013-06-19 MED ORDER — LORAZEPAM 0.5 MG PO TABS: 0.5000 mg | ORAL_TABLET | Freq: Three times a day (TID) | ORAL | Status: AC

## 2013-06-19 NOTE — Progress Notes (Signed)
RN went to check on patient and found patient's lips to be pale. Checked oxygen saturation and it was 88% on room air. Head of bed was raised and patient was placed on 2L [place] per family request on MOST form found in chart. Oxygen saturation came up to 97%. Will continue to monitor.

## 2013-06-19 NOTE — Clinical Social Work Psychosocial (Signed)
Clinical Social Work Department BRIEF PSYCHOSOCIAL ASSESSMENT 06/19/2013  Patient:  Jillian Wilkerson, Jillian Wilkerson     Account Number:  [id number]     Admit date:  06/15/2013  Clinical Social Worker:  Lovey Newcomer  Date/Time:  06/19/2013 02:20 PM  Referred by:  Physician  Date Referred:  06/19/2013 Referred for  Residential hospice placement   Other Referral:   Interview type:  Family Other interview type:   Patient's daughter and son were interviewed at bedside.    PSYCHOSOCIAL DATA Living Status:  FACILITY Admitted from facility:  Other Level of care:  Assisted Living Primary support name:  Jillian Wilkerson Primary support relationship to patient:  CHILD, ADULT Degree of support available:   Support is good.    CURRENT CONCERNS Current Concerns  Post-Acute Placement   Other Concerns:    SOCIAL WORK ASSESSMENT / PLAN CSW met with patient's family at bedside to discuss residential hospice placement process. Patient's daughter was very upset as she states that she has been waiting all day to speak with a CSW about getting her mom to the hospice home in [place]. CSW validated daughter's frustration and apologized for her long wait, and explained that CSW came as soon as possible. Daughter Jillian Wilkerson states that she has called the hospice home in [place] and she states "they said they have bed, but we've probably waited to long now..." CSW explained that a patient has to be evaluated before they can be admitted to a residential hospice facility and that only the facility would be able to make this determination. Daughter continued to speak to CSW in a defensive manner stating that her mom is appropriate for the facility. CSW explained that the referral will be made to the hospice facility and the facility will likely follow up with CSW tomorrow. CSW once again apologized for the family's wait and explained that their frustration is understandable as they only want the best for their  mother. CSW asked if family would consider another hospice facility in case the hospice home in [place] is not available. Daughter Jillian Wilkerson stated, "We will only consider the hospice home in [facility] or [facility], all of the others are too far." CSW will make referrals to these facilities.   Assessment/plan status:  Psychosocial Support/Ongoing Assessment of Needs Other assessment/ plan:   Send referrals   Information/referral to community resources:   List of hospice facilities and CSW contact information given to daughter.    PATIENT'S/FAMILY'S RESPONSE TO PLAN OF CARE: Patient's family wants patient to DC to the hospice home in [facility] or [facility]. Family was frustrated by the time they had to wait patient could be assessed. Family is unwilling to consider other facilities.       Liz Beach MSW, [place], [place], [phone]

## 2013-06-19 NOTE — Progress Notes (Signed)
CSW (Clinical Child psychotherapistocial Worker) called in referral to [facility] representative and faxed referral to [facility].  Ashley Bultema, LCSWA [phone]

## 2013-06-19 NOTE — Discharge Summary (Addendum)
Physician Discharge Summary  Jillian Wilkerson RUE:[id number] DOB: Dec 30, 1924 DOA: 06/15/2013  PCP: Pcp Not In System  Admit date: 06/15/2013 Discharge date: 06/19/2013  Time spent: 30 minutes  Recommendations for Outpatient Follow-up:  1. Pt is being discharged to residential hospice when bed available.   2. Follow up with PCP as needed.   Discharge Diagnoses:  Principal Problem:   Sepsis Active Problems:   Dementia   UTI (lower urinary tract infection)   Aortic dissection   Hypothyroidism   Palliative care encounter   Dyspnea   Pain, generalized     Diet recommendation: comfort feeds.   Filed Weights   06/16/13 0922  Weight: 86.9 kg (191 lb 9.3 oz)    History of present illness:  Jillian Wilkerson is a 80 y.o. female with history of dementia, hypothyroidism was brought to the ER after patient was found to be weak and not doing well. In the ER patient was found to be hypotensive and on exam patient had mild abdominal tenderness. CT abdomen and pelvis was done which shows features concerning for type I aortic dissection. ED physician Dr. Juleen China discussed with family and on-call cardiothoracic surgeon. At this time family is requesting only comfort care and to continue with antibiotics for possible sepsis secondary to possible UTI. Palliative care consulted and family decided for hospice services with goal towards comfort care.    Hospital Course:  1. Sepsis from UTI and type 1 aortic dissection/ advanced dementia: at this time family has requested only comfort measures. No further labs or aggressive measures. Consulted palliative care for further recommendations. She has completed 5 days of IV antibiotics. We will discharge her on morphine and ativan for symptoms as needed.  PLAN to discharge her to residential hospice when bed available.     Procedures:  CT abdomen and pelvis.  Consultations:  Palliative care.   Discharge Exam: Filed Vitals:   06/19/13 0549  BP: 116/84   Pulse: 140  Temp: 97 F (36.1 C)  Resp: 32    General: alert afebrile comfortable Cardiovascular: s1s2 Respiratory: ctab  Discharge Instructions You were cared for by a hospitalist during your hospital stay. If you have any questions about your discharge medications or the care you received while you were in the hospital after you are discharged, you can call the unit and asked to speak with the hospitalist on call if the hospitalist that took care of you is not available. Once you are discharged, your primary care physician will handle any further medical issues. Please note that NO REFILLS for any discharge medications will be authorized once you are discharged, as it is imperative that you return to your primary care physician (or establish a relationship with a primary care physician if you do not have one) for your aftercare needs so that they can reassess your need for medications and monitor your lab values.      Discharge Orders   Future Orders Complete By Expires   Discharge instructions  As directed        Medication List    STOP taking these medications       alendronate 70 MG tablet  Commonly known as:  FOSAMAX     aspirin EC 81 MG tablet     donepezil 10 MG tablet  Commonly known as:  ARICEPT     escitalopram 20 MG tablet  Commonly known as:  LEXAPRO     GLUCOSAMINE CHONDROITIN COMPLX PO     NAMENDA XR 28 MG Cp24  Generic drug:  Memantine HCl ER     Vitamin D (Ergocalciferol) 50000 UNITS Caps capsule  Commonly known as:  DRISDOL      TAKE these medications       docusate sodium 100 MG capsule  Commonly known as:  COLACE  Take 100 mg by mouth 2 (two) times daily.     furosemide 20 MG tablet  Commonly known as:  LASIX  Take 20 mg by mouth 2 (two) times daily.     levothyroxine 75 MCG tablet  Commonly known as:  SYNTHROID, LEVOTHROID  Take 75 mcg by mouth daily before breakfast.     LORazepam 0.5 MG tablet  Commonly known as:  ATIVAN  Take 1  tablet (0.5 mg total) by mouth every 8 (eight) hours.     metoprolol tartrate 25 MG tablet  Commonly known as:  LOPRESSOR  Take 12.5 mg by mouth 2 (two) times daily.     mirtazapine 15 MG tablet  Commonly known as:  REMERON  Take 15 mg by mouth at bedtime.     morphine CONCENTRATE 10 mg / 0.5 ml concentrated solution  Take 0.25 mLs (5 mg total) by mouth every 4 (four) hours as needed for severe pain.       Allergies  Allergen Reactions  . Codeine Other (See Comments)    Labored breathing  . Penicillins Rash      The results of significant diagnostics from this hospitalization (including imaging, microbiology, ancillary and laboratory) are listed below for reference.    Significant Diagnostic Studies: Ct Abdomen Pelvis Wo Contrast  06/15/2013   CLINICAL DATA:  sepsis. abdominal tenderness. L abdominal mass?  EXAM: CT ABDOMEN AND PELVIS WITHOUT CONTRAST  TECHNIQUE: Multidetector CT imaging of the abdomen and pelvis was performed following the standard protocol without intravenous contrast.  COMPARISON:  None.  FINDINGS: Visualized portions of the mediastinum demonstrates high-density crescent shaped focus along the anterior lateral aspect of the ascending aorta. Differential considerations are a dissection versus an intramural hematoma. A high density pericardial effusion is also appreciated. Atherosclerotic calcifications are appreciated within the coronary vessels.  Areas of increased density appreciated within the lung bases. A small right pleural effusion and trace left pleural effusion is appreciated. Calcified granulomata within the the left lung base.  A 5 x 5 cm low attenuating mass appreciated within the dome of the live. This finding may represent a cyst though more ominous etiologies cannot be excluded. A smaller subcentimeter area is appreciated along the anterior periphery of the left lobe of the liver.  Noncontrast evaluation spleen, adrenals, pancreas, kidneys is  unremarkable. Calcified gallstones are appreciated within the gallbladder. Gallbladder fossa is otherwise unremarkable.  The pelvis is degraded by metallic artifact due to a right hip prostheses. Visualized portions of the bowel negative. The region concerning for a mass within the abdominal wall on the left is secondary to extension of the patient's pannus along the lateral abdominal wall.  Otherwise within the limitations of a noncontrast CT there is no evidence of abdominal or pelvic masses, free fluid, nor loculated fluid collections.  A 2 cm lytic focus is appreciated within the left lateral aspect of L3. This is an indeterminate finding and clinical correlation recommended. Multilevel degenerative changes are appreciated in the spine.  IMPRESSION: 1. Findings within the ascending aorta concerning for a type a dissection versus an intramural hematoma. Further evaluation with dedicated CTA of the chest is recommended. Critical Value/emergent results were called by telephone at the time  of interpretation on 06/15/2013 at 8:20 PM to Dr. Raeford RazorSTEPHEN KOHUT , who verbally acknowledged these results. A high density pericardial effusion is also appreciated concerning for hemorrhage into the pericardial space. 2. Atelectasis versus infiltrate within the lung bases as well as French Anaracy fusion 3. 5.5 cm low attenuating mass within the dome of the liver. Further evaluation of this finding with contrasted CT of the abdomen and pelvis is recommended. 4. Lytic lesion and L3 indeterminate clinical correlation recommended   Electronically Signed   By: Salome HolmesHector  Cooper M.D.   On: 06/15/2013 20:26   Ct Head Wo Contrast  05/26/2013   CLINICAL DATA:  Altered level of consciousness.  EXAM: CT HEAD WITHOUT CONTRAST  TECHNIQUE: Contiguous axial images were obtained from the base of the skull through the vertex without intravenous contrast.  COMPARISON:  CT C SPINE W/O CM dated 03/17/2013; CT HEAD W/O CM dated 03/02/2013  FINDINGS: The right MCA  seems slightly more dense than the left, but I suspect that this is due to fortuitous imaging cut rather than a true asymmetry.  Periventricular white matter and corona radiata hypodensities favor chronic ischemic microvascular white matter disease. No intracranial hemorrhage, mass lesion, or acute CVA. No hydrocephalus or significant change from the prior exam. .  IMPRESSION: 1. No acute intracranial findings. 2. Periventricular white matter and corona radiata hypodensities favor chronic ischemic microvascular white matter disease.   Electronically Signed   By: Herbie BaltimoreWalt  Liebkemann M.D.   On: 05/26/2013 19:33   Dg Chest Portable 1 View  06/15/2013   CLINICAL DATA:  Weakness  EXAM: PORTABLE CHEST - 1 VIEW  COMPARISON:  PA and lateral chest x-ray of May 26, 2012.  FINDINGS: The lungs are hypoinflated. There is no focal infiltrate. The cardiopericardial silhouette remains enlarged. The pulmonary vascularity is not engorged. There is no pleural effusion or pneumothorax. There is degenerative change of both shoulders.  IMPRESSION: There is enlargement of cardiac silhouette without pulmonary vascular congestion. There is no evidence of pneumonia nor other acute cardiopulmonary abnormality.   Electronically Signed   By: David  SwazilandJordan   On: 06/15/2013 20:27    Microbiology: Recent Results (from the past 240 hour(s))  CULTURE, BLOOD (ROUTINE X 2)     Status: None   Collection Time    06/15/13  7:36 PM      Result Value Ref Range Status   Specimen Description BLOOD RIGHT ARM UPPER   Final   Special Requests BOTTLES DRAWN AEROBIC AND ANAEROBIC 1CC   Final   Culture  Setup Time     Final   Value: 06/16/2013 01:21     Performed at [facility]   Culture     Final   Value: STAPHYLOCOCCUS SPECIES (COAGULASE NEGATIVE)     Note: THE SIGNIFICANCE OF ISOLATING THIS ORGANISM FROM A SINGLE SET OF BLOOD CULTURES WHEN MULTIPLE SETS ARE DRAWN IS UNCERTAIN. PLEASE NOTIFY THE MICROBIOLOGY DEPARTMENT WITHIN ONE WEEK  IF SPECIATION AND SENSITIVITIES ARE REQUIRED.     17 Note: Gram Stain Report Called to,Read Back By and Verified With: Parke PoissonNIKKI SAUCETTE RN 4 15 750P [name]     Performed at [facility]   Report Status 06/17/2013 FINAL   Final  CULTURE, BLOOD (ROUTINE X 2)     Status: None   Collection Time    06/15/13  8:07 PM      Result Value Ref Range Status   Specimen Description BLOOD RIGHT ARM   Final   Special Requests BOTTLES  DRAWN AEROBIC AND ANAEROBIC 10CC   Final   Culture  Setup Time     Final   Value: 06/16/2013 01:21     Performed at [facility]   Culture     Final   Value:        BLOOD CULTURE RECEIVED NO GROWTH TO DATE CULTURE WILL BE HELD FOR 5 DAYS BEFORE ISSUING A FINAL NEGATIVE REPORT     Performed at [facility]   Report Status PENDING   Incomplete  MRSA PCR SCREENING     Status: Abnormal   Collection Time    06/15/13 11:59 PM      Result Value Ref Range Status   MRSA by PCR POSITIVE (*) NEGATIVE Final   Comment:            The GeneXpert MRSA Assay (FDA     approved for NASAL specimens     only), is one component of a     comprehensive MRSA colonization     surveillance program. It is not     intended to diagnose MRSA     infection nor to guide or     monitor treatment for     MRSA infections.     RESULT CALLED TO, READ BACK BY AND VERIFIED WITH:     T. BUTLER RN [phone] GREEN R     Labs: Basic Metabolic Panel:  Recent Labs Lab 06/15/13 1836  NA 143  K 3.7  CL 100  CO2 17*  GLUCOSE 172*  BUN 19  CREATININE 0.99  CALCIUM 8.4  MG 1.9   Liver Function Tests: No results found for this basename: AST, ALT, ALKPHOS, BILITOT, PROT, ALBUMIN,  in the last 168 hours No results found for this basename: LIPASE, AMYLASE,  in the last 168 hours No results found for this basename: AMMONIA,  in the last 168 hours CBC:  Recent Labs Lab 06/15/13 1836  WBC 12.2*  NEUTROABS 10.1*  HGB 10.1*  HCT 32.6*  MCV 92.9  PLT 189   Cardiac  Enzymes:  Recent Labs Lab 06/15/13 1836  TROPONINI <0.30   BNP: BNP (last 3 results) No results found for this basename: PROBNP,  in the last 8760 hours CBG: No results found for this basename: GLUCAP,  in the last 168 hours     Signed:  Kathlen ModyVijaya Sosha Shepherd  [facility] 06/19/2013, 8:54 AM

## 2013-06-19 NOTE — Consult Note (Signed)
[facility] Liaison: Received VM request from CSW at 3:08 for [facility] in [facility]. Chart reviewed. Unfortunately no [facility] availability at this time. Have made CSW aware. Will update CSW if availability for this patient changes. Thank you. Forrestine Himva Calhoun Reichardt LCSW [phone]

## 2013-06-19 NOTE — Care Management Note (Signed)
    Page 1 of 1   06/20/2013     11:25:40 AM CARE MANAGEMENT NOTE 06/20/2013  Patient:  Jillian Wilkerson,Jillian Wilkerson   Account Number:  [id number]  Date Initiated:  06/19/2013  Documentation initiated by:  Letha CapeAYLOR,Michell Giuliano  Subjective/Objective Assessment:   dx sepsis  [facility]     Action/Plan:   Palliative    plan for residential hospice.   Anticipated DC Date:  06/20/2013   Anticipated DC Plan:  HOSPICE MEDICAL FACILITY  In-house referral  Clinical Social Worker      DC Planning Services  CM consult      [facility]   Choice offered to / List presented to:             Status of service:  In process, will continue to follow Medicare Important Message given?   (If response is "NO", the following Medicare IM given date fields will be blank) Date Medicare IM given:   Date Additional Medicare IM given:    Discharge Disposition:    Per UR Regulation:    If discussed at Long Length of Stay Meetings, dates discussed:    Comments:  06/20/13 1124 Letha Capeeborah Severa Jeremiah RN, BSN [phone] patient is for dc to Residential Hospice today.  06/19/13 1644 Letha Capeeborah Nike Southers RN , BSN [phone] patient had palliative meeting, plan is for Residential Hospice, CSW following.

## 2013-06-20 MED ORDER — LORAZEPAM 1 MG PO TABS
1.0000 mg | ORAL_TABLET | ORAL | Status: DC | PRN
  Administered 2013-06-20: 1 mg via ORAL
  Filled 2013-06-20: qty 1

## 2013-06-20 MED ORDER — MORPHINE SULFATE (CONCENTRATE) 10 MG /0.5 ML PO SOLN
5.0000 mg | ORAL | Status: DC
  Administered 2013-06-20: 5 mg via ORAL
  Filled 2013-06-20: qty 0.5

## 2013-06-20 MED ORDER — MORPHINE SULFATE (CONCENTRATE) 10 MG /0.5 ML PO SOLN
5.0000 mg | ORAL | Status: DC | PRN
  Administered 2013-06-20: 5 mg via ORAL
  Administered 2013-06-20: 17:00:00 via ORAL
  Filled 2013-06-20 (×2): qty 0.5

## 2013-06-20 NOTE — Discharge Summary (Addendum)
Physician Discharge Summary  Jillian Wilkerson ONG:[id number] DOB: 07/23/1924 DOA: 06/15/2013  PCP: Pcp Not In System  Admit date: 06/15/2013 Discharge date: 06/20/2013  Time spent: 30 minutes  Recommendations for Outpatient Follow-up:  1. Pt is being discharged to residential hospice when bed available.   2. Follow up with PCP as needed.   Discharge Diagnoses:  Principal Problem:   Sepsis Active Problems:   Dementia   UTI (lower urinary tract infection)   Aortic dissection   Hypothyroidism   Palliative care encounter   Dyspnea   Pain, generalized     Diet recommendation: comfort feeds.   Filed Weights   06/16/13 0922  Weight: 86.9 kg (191 lb 9.3 oz)    History of present illness:  Jillian BreechMeta Curtice is a 80 y.o. female with history of dementia, hypothyroidism was brought to the ER after patient was found to be weak and not doing well. In the ER patient was found to be hypotensive and on exam patient had mild abdominal tenderness. CT abdomen and pelvis was done which shows features concerning for type I aortic dissection. ED physician Dr. Juleen ChinaKohut discussed with family and on-call cardiothoracic surgeon. At this time family is requesting only comfort care and to continue with antibiotics for possible sepsis secondary to possible UTI. Palliative care consulted and family decided for hospice services with goal towards comfort care.    Hospital Course:  1. Sepsis from UTI : She was admitted to the hospital and she received 6 days of IV antibiotics so far. One of her blood cultures came back positive for coagulase negative staph and we think its probably a contaminant. Family wanted her to have antibiotics in the hospital till she get discharged.   2. Type 1 aortic dissection:  - poor prognosis in view of her age and the dissection. EDP discussed with CT surgery and recommended that she is not a surgical candidate for any intervention.  - she reported sob and chest pain. And she will need  oxygen for support and pain medications with roxanol as recommended by palliative care.  Palliative care was consulted after admission and at this time family has requested only comfort measures. No further labs or aggressive measures.  We will discharge her on morphine and ativan for symptoms of chest pain and agitation, as needed.  PLAN to discharge her to residential hospice when bed available.   3. Tachycardia:  - resume metoprolol for tachycardia.   4. Hypotension;  - she received fluids and her hypotension is resolved.   5. Hallucinations;  - resolved with haldol .   Procedures:  CT abdomen and pelvis.  Consultations:  Palliative care.   Discharge Exam: Filed Vitals:   06/20/13 0539  BP: 140/88  Pulse: 125  Temp: 98.2 F (36.8 C)  Resp: 22    General: alert afebrile comfortable Cardiovascular: s1s2 Respiratory: ctab  Discharge Instructions You were cared for by a hospitalist during your hospital stay. If you have any questions about your discharge medications or the care you received while you were in the hospital after you are discharged, you can call the unit and asked to speak with the hospitalist on call if the hospitalist that took care of you is not available. Once you are discharged, your primary care physician will handle any further medical issues. Please note that NO REFILLS for any discharge medications will be authorized once you are discharged, as it is imperative that you return to your primary care physician (or establish a relationship with  a primary care physician if you do not have one) for your aftercare needs so that they can reassess your need for medications and monitor your lab values.      Discharge Orders   Future Orders Complete By Expires   Discharge instructions  As directed        Medication List    STOP taking these medications       alendronate 70 MG tablet  Commonly known as:  FOSAMAX     aspirin EC 81 MG tablet     donepezil  10 MG tablet  Commonly known as:  ARICEPT     escitalopram 20 MG tablet  Commonly known as:  LEXAPRO     GLUCOSAMINE CHONDROITIN COMPLX PO     NAMENDA XR 28 MG Cp24  Generic drug:  Memantine HCl ER     Vitamin D (Ergocalciferol) 50000 UNITS Caps capsule  Commonly known as:  DRISDOL      TAKE these medications       docusate sodium 100 MG capsule  Commonly known as:  COLACE  Take 100 mg by mouth 2 (two) times daily.     furosemide 20 MG tablet  Commonly known as:  LASIX  Take 20 mg by mouth 2 (two) times daily.     levothyroxine 75 MCG tablet  Commonly known as:  SYNTHROID, LEVOTHROID  Take 75 mcg by mouth daily before breakfast.     LORazepam 0.5 MG tablet  Commonly known as:  ATIVAN  Take 1 tablet (0.5 mg total) by mouth every 8 (eight) hours.     metoprolol tartrate 25 MG tablet  Commonly known as:  LOPRESSOR  Take 12.5 mg by mouth 2 (two) times daily.     mirtazapine 15 MG tablet  Commonly known as:  REMERON  Take 15 mg by mouth at bedtime.     morphine CONCENTRATE 10 mg / 0.5 ml concentrated solution  Take 0.25 mLs (5 mg total) by mouth every 4 (four) hours as needed for severe pain.       Allergies  Allergen Reactions  . Codeine Other (See Comments)    Labored breathing  . Penicillins Rash      The results of significant diagnostics from this hospitalization (including imaging, microbiology, ancillary and laboratory) are listed below for reference.    Significant Diagnostic Studies: Ct Abdomen Pelvis Wo Contrast  06/15/2013   CLINICAL DATA:  sepsis. abdominal tenderness. L abdominal mass?  EXAM: CT ABDOMEN AND PELVIS WITHOUT CONTRAST  TECHNIQUE: Multidetector CT imaging of the abdomen and pelvis was performed following the standard protocol without intravenous contrast.  COMPARISON:  None.  FINDINGS: Visualized portions of the mediastinum demonstrates high-density crescent shaped focus along the anterior lateral aspect of the ascending aorta.  Differential considerations are a dissection versus an intramural hematoma. A high density pericardial effusion is also appreciated. Atherosclerotic calcifications are appreciated within the coronary vessels.  Areas of increased density appreciated within the lung bases. A small right pleural effusion and trace left pleural effusion is appreciated. Calcified granulomata within the the left lung base.  A 5 x 5 cm low attenuating mass appreciated within the dome of the live. This finding may represent a cyst though more ominous etiologies cannot be excluded. A smaller subcentimeter area is appreciated along the anterior periphery of the left lobe of the liver.  Noncontrast evaluation spleen, adrenals, pancreas, kidneys is unremarkable. Calcified gallstones are appreciated within the gallbladder. Gallbladder fossa is otherwise unremarkable.  The pelvis is  degraded by metallic artifact due to a right hip prostheses. Visualized portions of the bowel negative. The region concerning for a mass within the abdominal wall on the left is secondary to extension of the patient's pannus along the lateral abdominal wall.  Otherwise within the limitations of a noncontrast CT there is no evidence of abdominal or pelvic masses, free fluid, nor loculated fluid collections.  A 2 cm lytic focus is appreciated within the left lateral aspect of L3. This is an indeterminate finding and clinical correlation recommended. Multilevel degenerative changes are appreciated in the spine.  IMPRESSION: 1. Findings within the ascending aorta concerning for a type a dissection versus an intramural hematoma. Further evaluation with dedicated CTA of the chest is recommended. Critical Value/emergent results were called by telephone at the time of interpretation on 06/15/2013 at 8:20 PM to Dr. Raeford Razor , who verbally acknowledged these results. A high density pericardial effusion is also appreciated concerning for hemorrhage into the pericardial  space. 2. Atelectasis versus infiltrate within the lung bases as well as French Ana fusion 3. 5.5 cm low attenuating mass within the dome of the liver. Further evaluation of this finding with contrasted CT of the abdomen and pelvis is recommended. 4. Lytic lesion and L3 indeterminate clinical correlation recommended   Electronically Signed   By: Salome Holmes M.D.   On: 06/15/2013 20:26   Ct Head Wo Contrast  05/26/2013   CLINICAL DATA:  Altered level of consciousness.  EXAM: CT HEAD WITHOUT CONTRAST  TECHNIQUE: Contiguous axial images were obtained from the base of the skull through the vertex without intravenous contrast.  COMPARISON:  CT C SPINE W/O CM dated 03/17/2013; CT HEAD W/O CM dated 03/02/2013  FINDINGS: The right MCA seems slightly more dense than the left, but I suspect that this is due to fortuitous imaging cut rather than a true asymmetry.  Periventricular white matter and corona radiata hypodensities favor chronic ischemic microvascular white matter disease. No intracranial hemorrhage, mass lesion, or acute CVA. No hydrocephalus or significant change from the prior exam. .  IMPRESSION: 1. No acute intracranial findings. 2. Periventricular white matter and corona radiata hypodensities favor chronic ischemic microvascular white matter disease.   Electronically Signed   By: Herbie Baltimore M.D.   On: 05/26/2013 19:33   Dg Chest Portable 1 View  06/15/2013   CLINICAL DATA:  Weakness  EXAM: PORTABLE CHEST - 1 VIEW  COMPARISON:  PA and lateral chest x-ray of May 26, 2012.  FINDINGS: The lungs are hypoinflated. There is no focal infiltrate. The cardiopericardial silhouette remains enlarged. The pulmonary vascularity is not engorged. There is no pleural effusion or pneumothorax. There is degenerative change of both shoulders.  IMPRESSION: There is enlargement of cardiac silhouette without pulmonary vascular congestion. There is no evidence of pneumonia nor other acute cardiopulmonary abnormality.    Electronically Signed   By: David  [place]   On: 06/15/2013 20:27    Microbiology: Recent Results (from the past 240 hour(s))  CULTURE, BLOOD (ROUTINE X 2)     Status: None   Collection Time    06/15/13  7:36 PM      Result Value Ref Range Status   Specimen Description BLOOD RIGHT ARM UPPER   Final   Special Requests BOTTLES DRAWN AEROBIC AND ANAEROBIC 1CC   Final   Culture  Setup Time     Final   Value: 06/16/2013 01:21     Performed at [facility]  Final   Value: STAPHYLOCOCCUS SPECIES (COAGULASE NEGATIVE)     Note: THE SIGNIFICANCE OF ISOLATING THIS ORGANISM FROM A SINGLE SET OF BLOOD CULTURES WHEN MULTIPLE SETS ARE DRAWN IS UNCERTAIN. PLEASE NOTIFY THE MICROBIOLOGY DEPARTMENT WITHIN ONE WEEK IF SPECIATION AND SENSITIVITIES ARE REQUIRED.     17 Note: Gram Stain Report Called to,Read Back By and Verified With: Parke PoissonNIKKI SAUCETTE RN 4 15 750P [name]     Performed at [facility]   Report Status 06/17/2013 FINAL   Final  CULTURE, BLOOD (ROUTINE X 2)     Status: None   Collection Time    06/15/13  8:07 PM      Result Value Ref Range Status   Specimen Description BLOOD RIGHT ARM   Final   Special Requests BOTTLES DRAWN AEROBIC AND ANAEROBIC 10CC   Final   Culture  Setup Time     Final   Value: 06/16/2013 01:21     Performed at [facility]   Culture     Final   Value:        BLOOD CULTURE RECEIVED NO GROWTH TO DATE CULTURE WILL BE HELD FOR 5 DAYS BEFORE ISSUING A FINAL NEGATIVE REPORT     Performed at [facility]   Report Status PENDING   Incomplete  MRSA PCR SCREENING     Status: Abnormal   Collection Time    06/15/13 11:59 PM      Result Value Ref Range Status   MRSA by PCR POSITIVE (*) NEGATIVE Final   Comment:            The GeneXpert MRSA Assay (FDA     approved for NASAL specimens     only), is one component of a     comprehensive MRSA colonization     surveillance program. It is not     intended to diagnose MRSA     infection  nor to guide or     monitor treatment for     MRSA infections.     RESULT CALLED TO, READ BACK BY AND VERIFIED WITH:     T. BUTLER RN [phone] GREEN R     Labs: Basic Metabolic Panel:  Recent Labs Lab 06/15/13 1836  NA 143  K 3.7  CL 100  CO2 17*  GLUCOSE 172*  BUN 19  CREATININE 0.99  CALCIUM 8.4  MG 1.9   Liver Function Tests: No results found for this basename: AST, ALT, ALKPHOS, BILITOT, PROT, ALBUMIN,  in the last 168 hours No results found for this basename: LIPASE, AMYLASE,  in the last 168 hours No results found for this basename: AMMONIA,  in the last 168 hours CBC:  Recent Labs Lab 06/15/13 1836  WBC 12.2*  NEUTROABS 10.1*  HGB 10.1*  HCT 32.6*  MCV 92.9  PLT 189   Cardiac Enzymes:  Recent Labs Lab 06/15/13 1836  TROPONINI <0.30   BNP: BNP (last 3 results) No results found for this basename: PROBNP,  in the last 8760 hours CBG: No results found for this basename: GLUCAP,  in the last 168 hours     Signed:  Kathlen ModyVijaya Delene Morais  [facility] 06/20/2013, 10:39 AM

## 2013-06-20 NOTE — Clinical Social Work Note (Signed)
[facility] states that [facility] can accept patient. [facility] has also offered patient a bed. Liaison with [facility] is meeting with family at bedside to discuss [facility]. CSW will stay to assist with patient's discharge to whichever facility the family chooses.  Roddie McBryant Shooter Tangen MSW, [place]CSWA, [place], [phone]

## 2013-06-20 NOTE — Progress Notes (Signed)
[facility], Berle MullLynn Henretta Quist RN

## 2013-06-20 NOTE — Progress Notes (Signed)
Procedure note, 14 french foley catheter inserted by Delcine Laural BenesJohnson RN after unsuccessful attempt by this writer, secured with foley catheter strap, daughter and son at bedside, awaiting palliative residential bed, Berle MullLynn Jennaya Pogue RN

## 2013-06-20 NOTE — Progress Notes (Signed)
Progress Note [facility] at [facility]  Subjective: patient is minimally responsive, unable to folow commands  -daughter and son at bedside-continued conversation regardng GOC and natural trajectory at EOL  --Focus is full comfort -no artifical hydration  -offer fluids and po's only if patient is awake and accepting- at family request do not stimulate to take diet -no further antibiotics -O2 for comfort only, no need to check O2 Sats -foley cath for comfort -symptoms management specific to pain and dyspnea     Objective: Allergies  Allergen Reactions  . Codeine Other (See Comments)    Labored breathing  . Penicillins Rash   Scheduled Meds: . morphine CONCENTRATE  5 mg Oral Q4H   Continuous Infusions:  PRN Meds:.acetaminophen, acetaminophen, furosemide, haloperidol lactate, LORazepam, LORazepam, morphine injection, morphine CONCENTRATE  BP 140/88  Pulse 125  Temp(Src) 98.2 F (36.8 C) (Oral)  Resp 22  Ht 5\' 5"  (1.651 m)  Wt 86.9 kg (191 lb 9.3 oz)  BMI 31.88 kg/m2  SpO2 91%   PPS:30 % at best    Intake/Output Summary (Last 24 hours) at 06/20/13 1240 Last data filed at 06/20/13 0900  Gross per 24 hour  Intake    100 ml  Output      0 ml  Net    100 ml        Physical Exam:  General: ill appearing, increased WOB HEENT:  Mm, no exudate Chest:   Decreased in bases CVS: tachcardic Abdomen: obese, soft +BS Ext: BLE trace edema Neuro:minimally responsive  Labs: CBC    Component Value Date/Time   WBC 12.2* 06/15/2013 1836   RBC 3.51* 06/15/2013 1836   HGB 10.1* 06/15/2013 1836   HCT 32.6* 06/15/2013 1836   PLT 189 06/15/2013 1836   MCV 92.9 06/15/2013 1836   MCH 28.8 06/15/2013 1836   MCHC 31.0 06/15/2013 1836   RDW 15.7* 06/15/2013 1836   LYMPHSABS 1.0 06/15/2013 1836   MONOABS 1.0 06/15/2013 1836   EOSABS 0.0 06/15/2013 1836   BASOSABS 0.1 06/15/2013 1836    BMET    Component Value Date/Time   NA 143 06/15/2013 1836   K 3.7  06/15/2013 1836   CL 100 06/15/2013 1836   CO2 17* 06/15/2013 1836   GLUCOSE 172* 06/15/2013 1836   BUN 19 06/15/2013 1836   CREATININE 0.99 06/15/2013 1836   CALCIUM 8.4 06/15/2013 1836   GFRNONAA 50* 06/15/2013 1836   GFRAA 58* 06/15/2013 1836    CMP     Component Value Date/Time   NA 143 06/15/2013 1836   K 3.7 06/15/2013 1836   CL 100 06/15/2013 1836   CO2 17* 06/15/2013 1836   GLUCOSE 172* 06/15/2013 1836   BUN 19 06/15/2013 1836   CREATININE 0.99 06/15/2013 1836   CALCIUM 8.4 06/15/2013 1836   PROT 6.5 03/03/2013 0520   ALBUMIN 2.7* 03/03/2013 0520   AST 9 03/03/2013 0520   ALT 6 03/03/2013 0520   ALKPHOS 55 03/03/2013 0520   BILITOT 0.4 03/03/2013 0520   GFRNONAA 50* 06/15/2013 1836   GFRAA 58* 06/15/2013 1836    Assessment and Plan: 1. Code Status:  DNR/DNI-comfort is main focus of care 2. Symptom Control: Pain/Dyspnea:      Roxanol 5 mg po every 4 hrs scheduled     Roxanol 5 mg po every 1 hr prn Agitation: Ativan 1 mg every 4 hrs prn 3. Psycho/Social:  Emotional support to family at bedside, they verbalize frustration regarding securing an appropriate  discharge plan for this patient at this time. 4. Disposition:  Hopeful for residential hospice-prognosis is poor likely days to weeks.  Patient Documents Completed or Given: Document Given Completed  Advanced Directives Pkt    MOST yes   DNR    Gone from My Sight yes   Hard Choices yes     Time In Time Out Total Time Spent with Patient Total Overall Time  1300 1325 25 min 25 min    Greater than 50%  of this time was spent counseling and coordinating care related to the above assessment and plan.  Lorinda CreedMary Pennye Beeghly NP  Palliative Medicine Team Team Phone # [phone] Pager [phone]  Discussed with Dr Blake DivineAkula 1

## 2013-06-20 NOTE — Clinical Social Work Note (Signed)
CSW has made several attempts to get patient a bed [facility] as this is the family's preference. Facility stated that they "needed more information about patient's symptoms and how to manage those symptoms." CSW has provided the facility with all of this information. Facility states that they do not have an answer. Patient's family states that [facility] are [facility] not appear to be options at this point. CSW has explained to patient's family that if patient is not offered a bed at one of the three facilities that patient would need to [facility] hosp[facility] services. Patient's family is pushing against this and has continued to state that they do not want patient to [facility].  Roddie McBryant Shuayb Schepers MSW, [place]CSWA, [place], [phone]

## 2013-06-20 NOTE — Progress Notes (Signed)
Nutrition Brief Note  RD drawn to chart 2/2 Low Braden score. Chart reviewed. Pt now transitioning to comfort care.  No further nutrition interventions warranted at this time.  Please re-consult as needed.   Jarold MottoSamantha Kissa Campoy MS, RD, LDN Inpatient Registered Dietitian Pager: [phone] After-hours pager: [phone]

## 2013-06-21 NOTE — Clinical Social Work Note (Addendum)
Per MD patient [facility] to [facility]. RN, patient's family, and facility aware of DC. RN given number for report. DC packet on chart. Next available ambulance transport requested for patient.   Roddie McBryant Rakwon Letourneau MSW, [place]CSWA, [place], [phone]

## 2013-06-22 LAB — CULTURE, BLOOD (ROUTINE X 2): Culture: NO GROWTH

## 2013-06-30 DEATH — deceased

## 2014-09-22 IMAGING — CT CT ABD-PELV W/ CM
1 of 3 series · 13 of 32 positions shown, 18 images · IV contrast (OMNIPAQUE 300)
Comparison: None.

CLINICAL DATA: Rectal bleeding and abdominal pain

CT ABDOMEN AND PELVIS WITH CONTRAST
TECHNIQUE: Multidetector CT imaging of the abdomen and pelvis was
performed following the standard protocol during bolus
administration of intravenous contrast.
Contrast: 100mL OMNIPAQUE IOHEXOL 300 MG/ML  SOLN, 50mL OMNIPAQUE
IOHEXOL 300 MG/ML  SOLN

[Series 2: abd/pel with · axial · 0.74mm/px · z∈[+1318,+1728]mm · 13 of 94 slices shown, 18 images]
[im 6/94  soft-tissue]
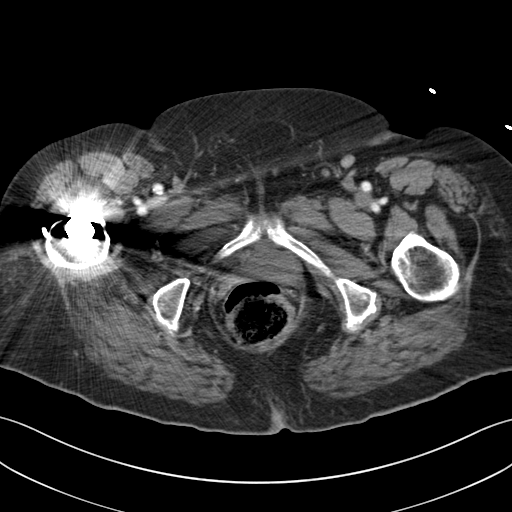
[im 6/94  bone]
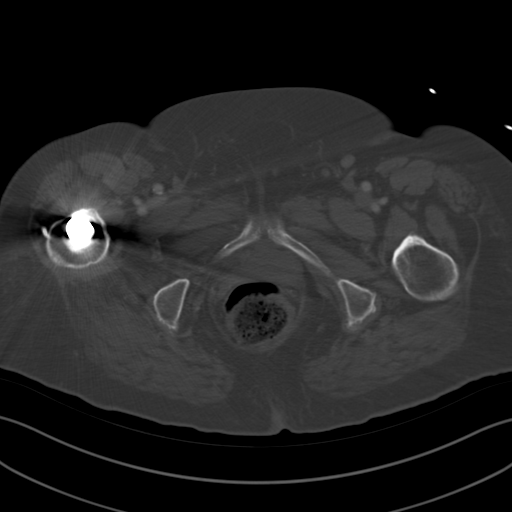
[im 16/94  soft-tissue]
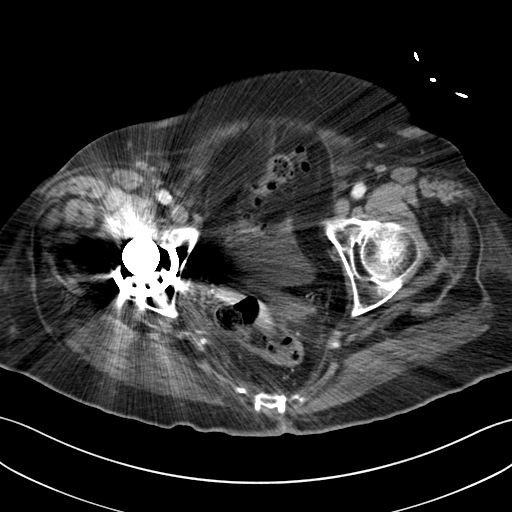
[im 21/94  soft-tissue]
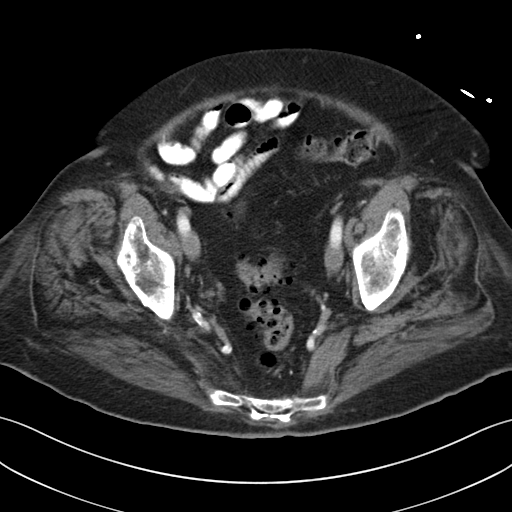
[im 26/94  soft-tissue]
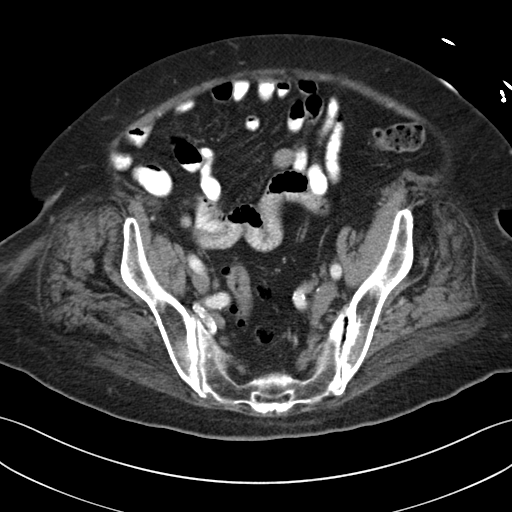
[im 37/94  soft-tissue]
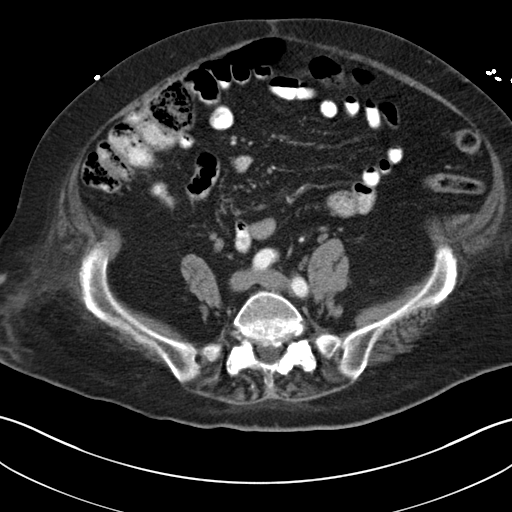
[im 42/94  soft-tissue]
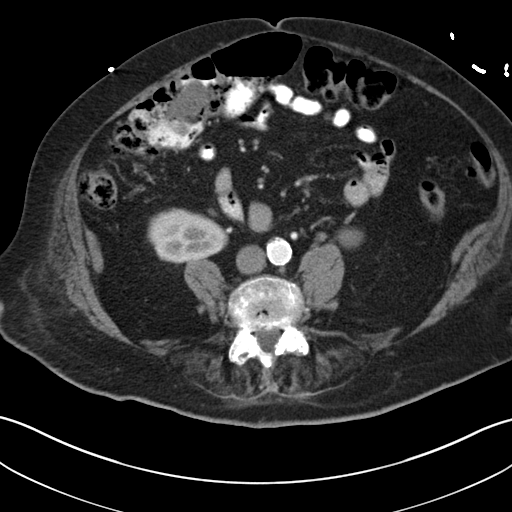
[im 52/94  soft-tissue]
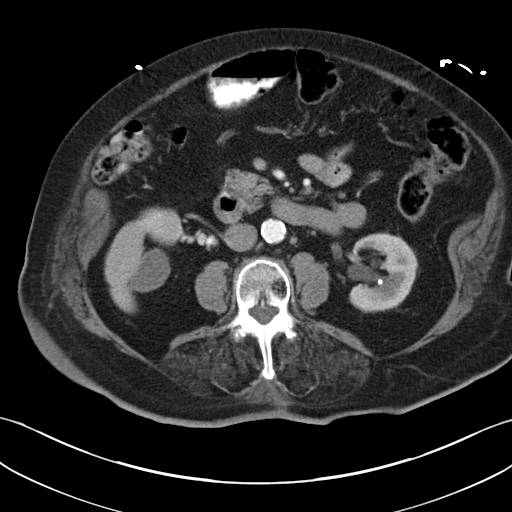
[im 57/94  soft-tissue]
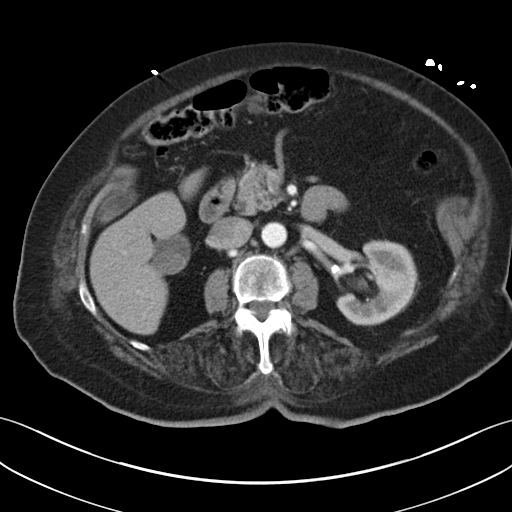
[im 68/94  soft-tissue]
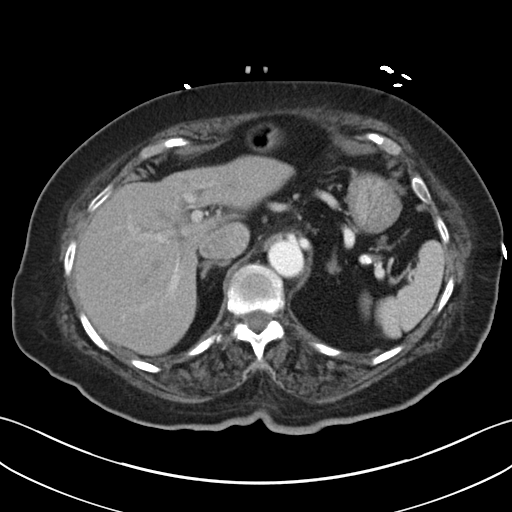
[im 68/94  bone]
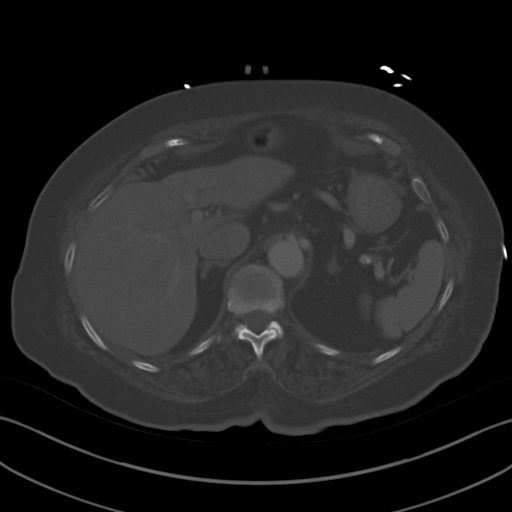
[im 73/94  soft-tissue]
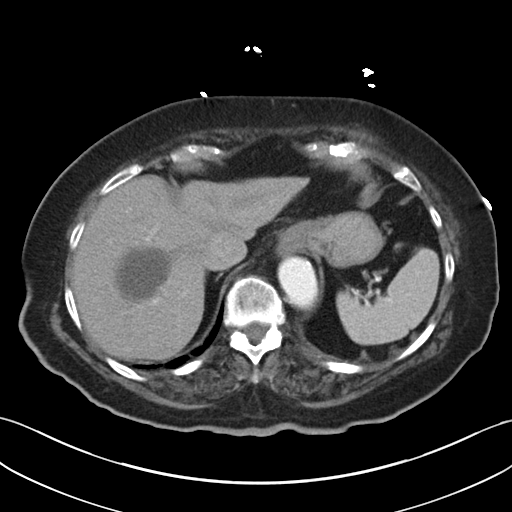
[im 73/94  lung]
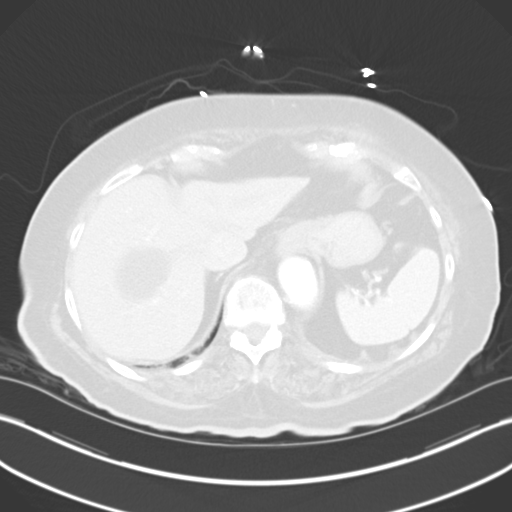
[im 78/94  soft-tissue]
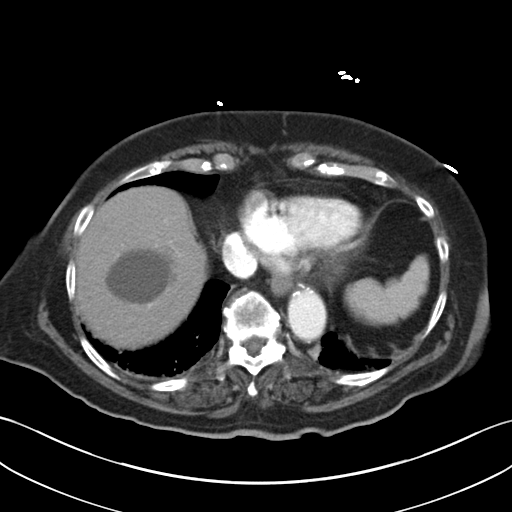
[im 78/94  lung]
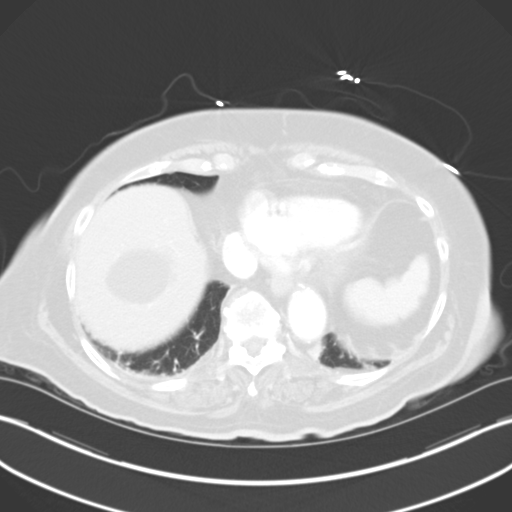
[im 83/94  lung]
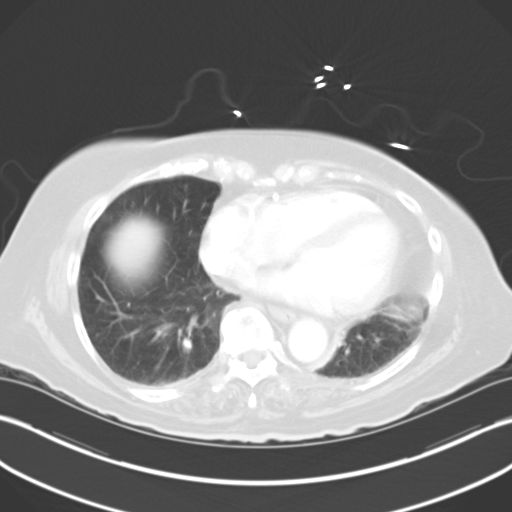
[im 88/94  soft-tissue]
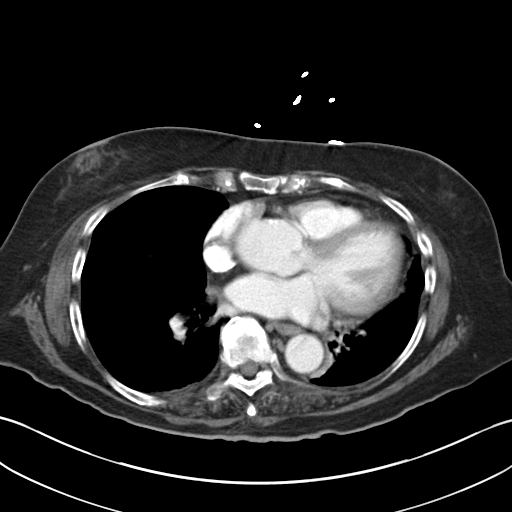
[im 88/94  lung]
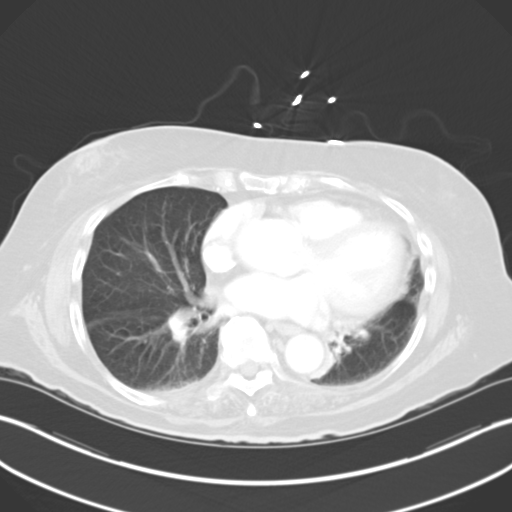

[13 of 32 positions shown; findings below may reference images not displayed]

FINDINGS: There is atelectasis noted within the lung bases.  No
pleural or pericardial effusion identified.

There is a fluid attenuating structure within the right hepatic
lobe measuring 5.2 cm, image 23/series 2.  Within the lateral
segment of the left hepatic lobe there is a smaller fluid
attenuating structure measuring 8 mm, image 23/series 2.
Multiple stones are identified within the gallbladder lumen.  These
measure up to 1.4 cm, image 37/series 2.  No biliary dilatation.
The pancreas appears within normal limits.  Normal appearance of
the spleen.

The adrenal glands are both within normal limits. Low lying and
malrotated right kidney is noted. Multiple mid pole and inferior
pole left kidney stones identified.  The largest measures 6 mm.  No
hydronephrosis or hydroureter identified.  Normal appearance of the
urinary bladder.

Calcified atherosclerotic disease affects the abdominal aorta.  No
aneurysm.  There is no upper abdominal adenopathy noted.  No pelvic
or inguinal adenopathy identified.

The stomach appears within normal limits.  The small bowel loops
are normal in course and caliber.  The proximal colon appears
normal.  Distal colonic diverticula are identified without acute
inflammation.

Review of the visualized osseous structures is significant for
multi level degenerative disc disease throughout the lumbar spine.
No aggressive lytic or sclerotic bone lesions identified.
Indeterminate nodule in the right breast measures 1.4 cm, image
3/series 2.
IMPRESSION: 1.  No acute findings identified within the abdomen or pelvis.
2.  Gallstones.
3.  Liver cysts.

4.  Left renal calculi.
5.  Sigmoid diverticular disease without acute inflammation.
6.  Indeterminant nodule in the right breast.  Recommend further
evaluation with dedicated breast imaging.
